# Patient Record
Sex: Female | Born: 1955 | Race: White | Hispanic: No | Smoking: Former smoker
Health system: Southern US, Community
[De-identification: ages and names within clinical notes are randomized; demographics above are authoritative.]

## PROBLEM LIST (undated history)

## (undated) DIAGNOSIS — H0510 Unspecified chronic inflammatory disorders of orbit: Secondary | ICD-10-CM

## (undated) DIAGNOSIS — H02841 Edema of right upper eyelid: Secondary | ICD-10-CM

## (undated) DIAGNOSIS — G4721 Circadian rhythm sleep disorder, delayed sleep phase type: Secondary | ICD-10-CM

## (undated) DIAGNOSIS — G4733 Obstructive sleep apnea (adult) (pediatric): Secondary | ICD-10-CM

## (undated) DIAGNOSIS — J309 Allergic rhinitis, unspecified: Secondary | ICD-10-CM

## (undated) DIAGNOSIS — E669 Obesity, unspecified: Secondary | ICD-10-CM

## (undated) DIAGNOSIS — I10 Essential (primary) hypertension: Secondary | ICD-10-CM

## (undated) DIAGNOSIS — Z6836 Body mass index (BMI) 36.0-36.9, adult: Secondary | ICD-10-CM

## (undated) DIAGNOSIS — E785 Hyperlipidemia, unspecified: Secondary | ICD-10-CM

## (undated) DIAGNOSIS — J45909 Unspecified asthma, uncomplicated: Secondary | ICD-10-CM

## (undated) DIAGNOSIS — M542 Cervicalgia: Secondary | ICD-10-CM

## (undated) DIAGNOSIS — K219 Gastro-esophageal reflux disease without esophagitis: Secondary | ICD-10-CM

## (undated) DIAGNOSIS — N12 Tubulo-interstitial nephritis, not specified as acute or chronic: Secondary | ICD-10-CM

## (undated) DIAGNOSIS — R9389 Abnormal findings on diagnostic imaging of other specified body structures: Principal | ICD-10-CM

## (undated) HISTORY — DX: Body mass index (BMI) 36.0-36.9, adult: Z68.36

## (undated) HISTORY — DX: Unspecified asthma, uncomplicated: J45.909

## (undated) HISTORY — DX: Allergic rhinitis, unspecified: J30.9

## (undated) HISTORY — DX: Cervicalgia: M54.2

## (undated) HISTORY — DX: Hyperlipidemia, unspecified: E78.5

## (undated) HISTORY — DX: Obesity, unspecified: E66.9

## (undated) HISTORY — DX: Circadian rhythm sleep disorder, delayed sleep phase type: G47.21

## (undated) HISTORY — DX: Obstructive sleep apnea (adult) (pediatric): G47.33

## (undated) HISTORY — DX: Gastro-esophageal reflux disease without esophagitis: K21.9

## (undated) NOTE — *Deleted (*Deleted)
Date: 10/12/2020               Patient Name:  Tamara Walton MRN: [id number]  DOB: 06/02/1956 Age / Sex: 48 y.o., female   PCP: Patient, No Pcp Per         Medical Service: Internal Medicine Teaching Service         Attending Physician: Dr. Rolan Bucco, MD    First Contact: Dr. Marland Kitchen Pager: [phone]  Second Contact: Dr. Eliezer Bottom Pager: [phone]-***       After Hours (After 5p/  First Contact Pager: [phone]  weekends / holidays): Second Contact Pager: [phone]   Chief Complaint: Cough/SOB  History of Present Illness: Tamara Walton is a 30 y.o. female with PMH of HTN, HLD, allergic rhinitis and adult onset asthma who presenting to the ED with cough and shortness of breath. Patient states her symptoms originally began around the beginning of the month. She saw her PCP via a virtual visit and prescribed prednisone 10 mg taper. Her symptoms improved initially, but a week ago, she started having worsening SOB and cough. She was seen today at [facility] and a CXR performed which showed left-sided pleural effusion. She was sent to [facility] for further evaluation and management of her pleural effusion. Patient endorse some dyspnea on exertion and feeling fatigue "no energy" but denies fever/chills, chest pain, abdominal pain, hemoptysis, back pain, lower extremity edema/pain, N/V/D, sweats or palpitations.   PCP is Jordan Hawks, PA-C with [facility]  Meds:  Current Meds  Medication Sig  . acetaminophen (TYLENOL) 500 MG tablet Take 1,000 mg by mouth every 6 (six) hours as needed for headache (pain).  Marland Kitchen albuterol (VENTOLIN HFA) 108 (90 Base) MCG/ACT inhaler Inhale 2 puffs into the lungs every 6 (six) hours as needed for wheezing or shortness of breath.  Marland Kitchen amLODipine (NORVASC) 5 MG tablet Take 5 mg by mouth daily.  . Calcium Carbonate Antacid (TUMS PO) Take 1 tablet by mouth 2 (two) times daily as needed (acid reflux).  . fluticasone (FLONASE) 50  MCG/ACT nasal spray Place 1 spray into both nostrils daily.  . hydrochlorothiazide (HYDRODIURIL) 25 MG tablet Take 25 mg by mouth daily.  . montelukast (SINGULAIR) 10 MG tablet Take 10 mg by mouth at bedtime.  . rosuvastatin (CRESTOR) 5 MG tablet Take 5 mg by mouth at bedtime.     Allergies: Allergies as of 10/12/2020 - Review Complete 10/12/2020  Allergen Reaction Noted  . Erythromycin Nausea And Vomiting 07/21/2012  . Strawberry (diagnostic) Hives 10/12/2020  . Amoxicillin-pot clavulanate Nausea And Vomiting and Nausea Only 09/29/2013  . Lisinopril Cough 09/27/2018   Past Medical History:  Diagnosis Date  . Hypertension     Family History: Significant for mother and father who died from lung cancer.   Social History: Lives by herself. She works in an office. She smoked a 1ppd of cigarettes for about 20 years and quit 13 years ago. She denies any ETOH or illicit drug use.   Review of Systems: A complete ROS was negative except as per HPI.   Physical Exam: Blood pressure (!) 158/96, pulse (!) 118, temperature 98.4 F (36.9 C), temperature source Oral, resp. rate (!) 26, height 5\' 5"  (1.651 m), weight 102.1 kg, SpO2 95 %.  General: Pleasant, well-appearing elderly woman laying in bed. No acute distress. Head: Normocephalic. Atraumatic. CV: Tachycardic. Regular Rhythm. No murmurs, rubs, or gallops. No LE edema Pulmonary: Lungs CTAB. Normal  effort. No wheezing. Mild bibasilar rales. Abdominal: Soft, nontender, nondistended. Normal bowel sounds. Extremities: Palpable pulses. Normal ROM. Skin: Warm and dry. No obvious rash or lesions. Neuro: A&Ox3. Moves all extremities. Normal sensation. No focal deficit. Psych: Normal mood and affect  EKG: personally reviewed my interpretation is Sinus tach  CXR: personally reviewed my interpretation is moderate to large left-sided pleural effusion with underlying opacity.  Assessment & Plan by Problem: Active Problems:   * No active  hospital problems. *  #Pleural effusion  #Tachycardia  #HTN  #HLD  #Allergic rhinitis   CODE STATUS: Full Code DIET: 2 gram Sodium PPx: Lovenox 40 mg subcu daily  Dispo: Admit patient to Inpatient with expected length of stay greater than 2 midnights.  Signed: Steffanie Rainwater, MD 10/12/2020, 8:55 PM  Pager: [phone] Internal Medicine Teaching Service After 5pm on weekdays and 1pm on weekends: On Call pager: [phone]

---

## 1999-06-11 ENCOUNTER — Ambulatory Visit (HOSPITAL_COMMUNITY): Admission: RE | Admit: 1999-06-11 | Discharge: 1999-06-11 | Payer: Self-pay | Admitting: Obstetrics and Gynecology

## 2001-03-24 ENCOUNTER — Encounter: Admission: RE | Admit: 2001-03-24 | Discharge: 2001-03-24 | Payer: Self-pay | Admitting: Obstetrics and Gynecology

## 2001-03-24 ENCOUNTER — Encounter: Payer: Self-pay | Admitting: Obstetrics and Gynecology

## 2001-04-07 ENCOUNTER — Other Ambulatory Visit: Admission: RE | Admit: 2001-04-07 | Discharge: 2001-04-07 | Payer: Self-pay | Admitting: Obstetrics and Gynecology

## 2001-04-07 ENCOUNTER — Encounter (INDEPENDENT_AMBULATORY_CARE_PROVIDER_SITE_OTHER): Payer: Self-pay

## 2001-06-20 ENCOUNTER — Ambulatory Visit (HOSPITAL_COMMUNITY): Admission: RE | Admit: 2001-06-20 | Discharge: 2001-06-20 | Payer: Self-pay | Admitting: *Deleted

## 2001-06-20 ENCOUNTER — Encounter: Payer: Self-pay | Admitting: *Deleted

## 2001-06-20 ENCOUNTER — Encounter (INDEPENDENT_AMBULATORY_CARE_PROVIDER_SITE_OTHER): Payer: Self-pay

## 2002-03-27 ENCOUNTER — Emergency Department (HOSPITAL_COMMUNITY): Admission: EM | Admit: 2002-03-27 | Discharge: 2002-03-27 | Payer: Self-pay | Admitting: Emergency Medicine

## 2002-03-27 ENCOUNTER — Encounter: Payer: Self-pay | Admitting: Emergency Medicine

## 2007-02-03 ENCOUNTER — Emergency Department (HOSPITAL_COMMUNITY): Admission: EM | Admit: 2007-02-03 | Discharge: 2007-02-04 | Payer: Self-pay | Admitting: *Deleted

## 2007-10-03 ENCOUNTER — Ambulatory Visit: Payer: Self-pay | Admitting: Gastroenterology

## 2007-10-03 LAB — CONVERTED CEMR LAB
Albumin: 3.7 g/dL (ref 3.5–5.2)
Basophils Absolute: 0.1 10*3/uL (ref 0.0–0.1)
CO2: 32 meq/L (ref 19–32)
Chloride: 102 meq/L (ref 96–112)
Creatinine, Ser: 0.7 mg/dL (ref 0.4–1.2)
Eosinophils Absolute: 0.2 10*3/uL (ref 0.0–0.6)
Glucose, Bld: 88 mg/dL (ref 70–99)
HCT: 37.8 % (ref 36.0–46.0)
Hemoglobin: 13.2 g/dL (ref 12.0–15.0)
MCHC: 34.8 g/dL (ref 30.0–36.0)
MCV: 87.9 fL (ref 78.0–100.0)
Monocytes Absolute: 0.6 10*3/uL (ref 0.2–0.7)
Monocytes Relative: 7.7 % (ref 3.0–11.0)
Neutrophils Relative %: 57.2 % (ref 43.0–77.0)
Sodium: 140 meq/L (ref 135–145)
Total Bilirubin: 0.6 mg/dL (ref 0.3–1.2)

## 2007-10-06 ENCOUNTER — Ambulatory Visit (HOSPITAL_COMMUNITY): Admission: RE | Admit: 2007-10-06 | Discharge: 2007-10-06 | Payer: Self-pay | Admitting: Gastroenterology

## 2007-11-08 ENCOUNTER — Ambulatory Visit: Payer: Self-pay | Admitting: Gastroenterology

## 2008-01-11 DIAGNOSIS — T7840XA Allergy, unspecified, initial encounter: Secondary | ICD-10-CM | POA: Insufficient documentation

## 2009-03-12 ENCOUNTER — Other Ambulatory Visit: Admission: RE | Admit: 2009-03-12 | Discharge: 2009-03-12 | Payer: Self-pay | Admitting: Family Medicine

## 2009-05-07 MED ORDER — METHYLPREDNISOLONE 4 MG TABS IN A DOSE PACK
4 mg | PACK | ORAL | Status: DC
Start: 2009-05-07 — End: 2010-02-27

## 2009-05-07 MED ORDER — CHLORPHEN-PHENYLEPH-HYDROCODON 2 MG-5 MG-2.5 MG/5 ML ORAL SYRUP
Freq: Four times a day (QID) | ORAL | Status: DC | PRN
Start: 2009-05-07 — End: 2010-02-27

## 2009-05-07 MED ORDER — OXYBUTYNIN CHLORIDE 5 MG TAB
5 mg | ORAL_TABLET | Freq: Two times a day (BID) | ORAL | Status: DC
Start: 2009-05-07 — End: 2010-02-27

## 2009-05-07 MED ORDER — AZITHROMYCIN 250 MG TAB
250 mg | ORAL_TABLET | ORAL | Status: AC
Start: 2009-05-07 — End: 2009-05-12

## 2009-05-07 NOTE — Progress Notes (Signed)
SUBJECTIVE:  Patient here for GYN exam.  It has been awhile since she has had one.  Complaining of some hot flashes.  Also complaining of stress urinary incontinence symptoms not relieved by Detrol previously.  Also his had recent URI symptoms for the past several days with several days of nasal congestion, clear mucus, cough, green sputum, shortness of breath and some wheezing.  She continues to smoke.    OBJECTIVE:  Alert and oriented.  No acute respiratory distress.  Ears, canals and TMs are normal.  Nasal passages are clear.  Pharynx is normal.  Neck without lymphadenopathy or masses.  Heart is regular.  Lungs with diffuse mild expiratory wheeze.  Breasts without masses.  External genitalia, vagina and cervix appear normal.  Pap smear was done.  Bimanual exam is normal.        ASSESSMENT:  Bronchitis.  Menopause.  Stress urinary incontinence.    PLAN:  Zithromax five-day course.  Histinex for cough.  Medrol Dosepak times one.  Ditropan short acting one b.i.d.  Return as needed.  Strongly urged to stop smoking.  Face-to-face time greater than 25 minutes.  Greater than 50% of today's visit devoted to counseling and coordination of care.

## 2009-05-07 NOTE — Progress Notes (Signed)
Patient here for gyn exam and also has a cold.  Tara Hogan

## 2009-05-08 NOTE — Progress Notes (Addendum)
Addended by: Lidia Collum on: 05/08/2009      Modules accepted: Orders

## 2009-08-05 ENCOUNTER — Ambulatory Visit: Payer: Self-pay | Admitting: Diagnostic Radiology

## 2009-08-05 ENCOUNTER — Encounter (INDEPENDENT_AMBULATORY_CARE_PROVIDER_SITE_OTHER): Payer: Self-pay | Admitting: *Deleted

## 2009-08-05 ENCOUNTER — Ambulatory Visit: Payer: Self-pay | Admitting: Internal Medicine

## 2009-08-05 ENCOUNTER — Encounter: Payer: Self-pay | Admitting: Emergency Medicine

## 2009-08-05 ENCOUNTER — Inpatient Hospital Stay (HOSPITAL_COMMUNITY): Admission: EM | Admit: 2009-08-05 | Discharge: 2009-08-09 | Payer: Self-pay | Admitting: Internal Medicine

## 2009-08-16 ENCOUNTER — Telehealth: Payer: Self-pay | Admitting: Gastroenterology

## 2010-02-27 LAB — AMB POC URINALYSIS DIP STICK MANUAL W/O MICRO
Bilirubin (UA POC): NEGATIVE
Glucose (UA POC): NEGATIVE
Ketones (UA POC): NEGATIVE
Leukocyte esterase (UA POC): NEGATIVE
Nitrites (UA POC): NEGATIVE
Protein (UA POC): NEGATIVE mg/dL
Specific gravity (UA POC): 1.03 (ref 1.001–1.035)
pH (UA POC): 5 (ref 4.6–8.0)

## 2010-02-27 MED ORDER — TOLTERODINE 2 MG TAB
2 mg | ORAL_TABLET | Freq: Two times a day (BID) | ORAL | Status: DC
Start: 2010-02-27 — End: 2017-05-21

## 2010-02-27 NOTE — Progress Notes (Signed)
SUBJECTIVE:  Patient here today for Pap smear.  Also having symptoms now compatible with stress urinary incontinence.  Previous Ditropan was not helpful.  Last mammogram several years ago.    OBJECTIVE:  External genitalia, vagina and cervix are somewhat atrophic.  Pap smear was done.  Bimanual exam shows slightly enlarged uterus.  No unusual masses.  Urinalysis dipstick is normal.    ASSESSMENT:  Menopause.  Stress urinary incontinence.    PLAN:  Sent for screening mammogram.  Screening lab work drawn today.  Try Detrol 2 mg b.i.d. For urinary symptoms.  If no improvement, consider urology consultation.  Face-to-face time greater than 25 minutes.  Greater than 50% of today's visit devoted to counseling and coordination of care.

## 2010-02-27 NOTE — Progress Notes (Signed)
Patient here for pap.  Tara Hogan

## 2010-02-28 LAB — METABOLIC PANEL, COMPREHENSIVE
A-G Ratio: 1.6 (ref 1.1–2.5)
ALT (SGPT): 14 IU/L (ref 0–40)
AST (SGOT): 18 IU/L (ref 0–40)
Albumin: 4.2 g/dL (ref 3.5–5.5)
Alk. phosphatase: 68 IU/L (ref 25–150)
BUN/Creatinine ratio: 17 (ref 9–23)
BUN: 18 mg/dL (ref 6–24)
Bilirubin, total: 0.2 mg/dL (ref 0.0–1.2)
CO2: 20 mmol/L (ref 20–32)
Calcium: 9.1 mg/dL (ref 8.7–10.2)
Chloride: 106 mmol/L (ref 97–108)
Creatinine: 1.05 mg/dL — ABNORMAL HIGH (ref 0.57–1.00)
GFR est AA: 71 mL/min/{1.73_m2} (ref >59)
GFR est non-AA: 61 mL/min/{1.73_m2} (ref >59)
GLOBULIN, TOTAL: 2.7 g/dL (ref 1.5–4.5)
Glucose: 71 mg/dL (ref 65–99)
Potassium: 4.9 mmol/L (ref 3.5–5.2)
Protein, total: 6.9 g/dL (ref 6.0–8.5)
Sodium: 142 mmol/L (ref 135–145)

## 2010-02-28 LAB — LIPID PANEL
Cholesterol, total: 188 mg/dL (ref 100–199)
HDL Cholesterol: 65 mg/dL (ref >39)
LDL, calculated: 101 mg/dL — ABNORMAL HIGH (ref 0–99)
Triglyceride: 108 mg/dL (ref 0–149)
VLDL, calculated: 22 mg/dL (ref 5–40)

## 2010-02-28 LAB — CBC W/O DIFF
HCT: 45.2 % — ABNORMAL HIGH (ref 34.0–44.0)
HGB: 15.6 g/dL — ABNORMAL HIGH (ref 11.5–15.0)
MCH: 32 pg (ref 27.0–34.0)
MCHC: 34.5 g/dL (ref 32.0–36.0)
MCV: 93 fL (ref 80–98)
PLATELET: 293 10*3/uL (ref 140–415)
RBC: 4.87 x10E6/uL (ref 3.80–5.10)
RDW: 13.5 % (ref 11.7–15.0)
WBC: 12.5 10*3/uL — ABNORMAL HIGH (ref 4.0–10.5)

## 2010-02-28 NOTE — Progress Notes (Addendum)
Quick Note:    Call pt.- results nl.    ______

## 2010-02-28 NOTE — Progress Notes (Addendum)
Quick Note:    Letter sent, invalid phone.    ______

## 2010-03-07 NOTE — Telephone Encounter (Signed)
Unable to reach patient to notify pap smear results.  Pap shows mild inflammation, not serious, recheck pap again in six months.  Letter sent.  Tara Hogan

## 2010-08-29 NOTE — Progress Notes (Signed)
Pt is here to see Dr for pap smear.Tara Hogan

## 2010-08-29 NOTE — Progress Notes (Signed)
SUBJECTIVE:  Patient returns for repeat Pap smear and HPV screen in April of this year her Pap smear was normal, HPV screen was positive. No bleeding or pelvic pain. She never got mammogram done.    OBJECTIVE:  External genitalia, vagina and cervix appear normal. Pap Smear and HPV was obtained again today.    ASSESSMENT:  As above.    PLAN:  Mammogram referral given to the patient. She says she will get a mammogram done.

## 2010-09-12 NOTE — Progress Notes (Signed)
Quick Note:    Call pt. PAP normal except evidence of inflammation. Not cancer. Not serious. I would like to recheck PAP in 6 months.  ______

## 2010-09-15 NOTE — Progress Notes (Signed)
Quick Note:    Pt given message.  Blanchard Willhite M Cendy Oconnor, RN   ______

## 2010-12-15 ENCOUNTER — Encounter: Payer: Self-pay | Admitting: Family Medicine

## 2011-02-27 LAB — COMPREHENSIVE METABOLIC PANEL
AST: 25 U/L (ref 0–37)
Albumin: 4.3 g/dL (ref 3.5–5.2)
Alkaline Phosphatase: 92 U/L (ref 39–117)
BUN: 6 mg/dL (ref 6–23)
CO2: 24 mEq/L (ref 19–32)
Chloride: 98 mEq/L (ref 96–112)
Creatinine, Ser: 1.1 mg/dL (ref 0.4–1.2)
GFR calc Af Amer: >60 mL/min (ref >60)
GFR calc non Af Amer: >60 mL/min (ref >60)
Glucose, Bld: 105 mg/dL — ABNORMAL HIGH (ref 70–99)
Potassium: 2.8 mEq/L — ABNORMAL LOW (ref 3.5–5.1)
Potassium: 3.9 mEq/L (ref 3.5–5.1)
Sodium: 136 mEq/L (ref 135–145)
Total Bilirubin: 0.5 mg/dL (ref 0.3–1.2)
Total Protein: 5.9 g/dL — ABNORMAL LOW (ref 6.0–8.3)

## 2011-02-27 LAB — FECAL LACTOFERRIN, QUANT

## 2011-02-27 LAB — CULTURE, BLOOD (ROUTINE X 2)

## 2011-02-27 LAB — BASIC METABOLIC PANEL
BUN: 4 mg/dL — ABNORMAL LOW (ref 6–23)
BUN: 6 mg/dL (ref 6–23)
CO2: 22 mEq/L (ref 19–32)
CO2: 25 mEq/L (ref 19–32)
Chloride: 110 mEq/L (ref 96–112)
Chloride: 110 mEq/L (ref 96–112)
Creatinine, Ser: 0.81 mg/dL (ref 0.4–1.2)
GFR calc non Af Amer: >60 mL/min (ref >60)
Glucose, Bld: 121 mg/dL — ABNORMAL HIGH (ref 70–99)
Glucose, Bld: 97 mg/dL (ref 70–99)
Potassium: 2.9 mEq/L — ABNORMAL LOW (ref 3.5–5.1)
Sodium: 139 mEq/L (ref 135–145)

## 2011-02-27 LAB — CBC
HCT: 28.4 % — ABNORMAL LOW (ref 36.0–46.0)
HCT: 30.3 % — ABNORMAL LOW (ref 36.0–46.0)
Hemoglobin: 9.7 g/dL — ABNORMAL LOW (ref 12.0–15.0)
Hemoglobin: 10.3 g/dL — ABNORMAL LOW (ref 12.0–15.0)
MCHC: 34.2 g/dL (ref 30.0–36.0)
MCHC: 34.2 g/dL (ref 30.0–36.0)
MCHC: 34.1 g/dL (ref 30.0–36.0)
MCV: 88.2 fL (ref 78.0–100.0)
MCV: 88.2 fL (ref 78.0–100.0)
MCV: 87.8 fL (ref 78.0–100.0)
Platelets: 261 10*3/uL (ref 150–400)
Platelets: 310 10*3/uL (ref 150–400)
RDW: 13.2 % (ref 11.5–15.5)
RDW: 13.7 % (ref 11.5–15.5)
RDW: 13.6 % (ref 11.5–15.5)
WBC: 13.8 10*3/uL — ABNORMAL HIGH (ref 4.0–10.5)

## 2011-02-27 LAB — RETICULOCYTES
RBC.: 3.62 MIL/uL — ABNORMAL LOW (ref 3.87–5.11)
RBC.: 4.27 MIL/uL (ref 3.87–5.11)
Retic Count, Absolute: 32.6 10*3/uL (ref 19.0–186.0)
Retic Ct Pct: 0.9 % (ref 0.4–3.1)
Retic Ct Pct: 1.8 % (ref 0.4–3.1)

## 2011-02-27 LAB — IRON AND TIBC
Iron: 13 ug/dL — ABNORMAL LOW (ref 42–135)
UIBC: 184 ug/dL

## 2011-02-27 LAB — STOOL CULTURE

## 2011-02-27 LAB — MAGNESIUM: Magnesium: 2.1 mg/dL (ref 1.5–2.5)

## 2011-02-27 LAB — POCT CARDIAC MARKERS
CKMB, poc: <1 ng/mL — ABNORMAL LOW (ref 1.0–8.0)
Myoglobin, poc: 88.7 ng/mL (ref 12–200)

## 2011-02-27 LAB — DIFFERENTIAL
Basophils Absolute: 0.2 10*3/uL — ABNORMAL HIGH (ref 0.0–0.1)
Eosinophils Relative: 0 % (ref 0–5)
Lymphocytes Relative: 13 % (ref 12–46)
Lymphs Abs: 1.7 10*3/uL (ref 0.7–4.0)
Monocytes Absolute: 0.9 10*3/uL (ref 0.1–1.0)

## 2011-02-27 LAB — CLOSTRIDIUM DIFFICILE EIA

## 2011-02-27 LAB — LACTIC ACID, PLASMA: Lactic Acid, Venous: 1.1 mmol/L (ref 0.5–2.2)

## 2011-02-27 LAB — OVA AND PARASITE EXAMINATION

## 2011-02-27 LAB — VITAMIN B12: Vitamin B-12: 198 pg/mL — ABNORMAL LOW (ref 211–911)

## 2011-02-27 LAB — HEMOCCULT GUIAC POC 1CARD (OFFICE): Fecal Occult Bld: POSITIVE

## 2011-04-07 NOTE — Assessment & Plan Note (Signed)
[facility]                         GASTROENTEROLOGY OFFICE NOTE   Tamara, Walton                     MRN:          [id number]  DATE:10/03/2007                            DOB:          05/11/1956    REFERRING PHYSICIAN:  Maryelizabeth Rowan, M.D.   REASON FOR REFERRAL:  Dr. Duanne Guess asked me to evaluate Ms. Frankowski in  consultation regarding epigastric pain, belching, nausea.   HISTORY OF PRESENT ILLNESS:  Tamara Walton is a very pleasant 55 year old  woman who has had approximately four months of a variety of upper GI  symptoms.  She says she has had flares or epigastric pain.  She points  to her mid epigastrium as the spot, it could be a tightness, somewhat of  a burning sensation, lasts for minutes to an hour or so.  She actually  wanted to present to the emergency room because she thought it may be  her heart, and they did an EKG and told her heart was fine.  Along with  this, she has had some nausea and some belching.  At some point, a  caregiver tried a GI cocktail on her and her symptoms did seem to  improve with that, so she was started on omeprazole.  She is taking once  daily omeprazole in the morning and did not really seem to notice a  difference, still had 2-3 flares and recently doubled her omeprazole.  Since doubling her omeprazole, she has only had one or two more flares.  She has never had frank pyrosis, never acid regurgitation and no  dysphagia.  Belching definitely helps her symptoms.   LABORATORY DATA:  Recent, August 2008, showed a normal comprehensive  metabolic profile.  H.pylori was negative.   REVIEW OF SYSTEMS:  Essentially normal and available on the nursing  intake sheet.   PAST MEDICAL HISTORY:  1. Hypertension.  2. Thyroid nodules.  3. Status post hysterectomy.  4. Status post tubal ligation.  5. Allergies for 30 years.   CURRENT MEDICATIONS:  Benicar, omeprazole, Singulair.   ALLERGIES:  ERYTHROMYCIN.   SOCIAL HISTORY:  Widowed with three children, works in Airline pilot, smokes a  pack of cigarettes a day, stopped drinking caffeine 3-4 months with the  beginning of her symptoms as someone told her could make things worse.  Rarely drinks alcohol.   FAMILY HISTORY:  No colon cancer, colon polyps in family.   PHYSICAL EXAMINATION:  VITAL SIGNS:  Height 5 feet 6 inches, 168 pounds,  blood pressure 150/90, pulse 68.  CONSTITUTIONAL:  Generally well-appearing.  NEUROLOGICAL:  Alert and oriented x3.  HEENT:  Eyes:  Extraocular movements intact.  Mouth:  Oropharynx moist.  No lesions.  NECK:  Supple.  No lymphadenopathy.  CARDIOVASCULAR:  Heart regular rate and rhythm.  LUNGS:  Clear to auscultation bilaterally.  ABDOMEN:  Soft, nontender, nondistended.  Normal bowel sounds.  EXTREMITIES:  No lower extremity edema.  SKIN:  No rashes or lesions of lower extremities.   ASSESSMENT/PLAN:  A 55 year old woman with a variety of upper GI  symptoms, possibly GERD, possibly GERD related dyspepsia, perhaps  possibly biliary source.  I think we will proceed with a repeat set of labs including complete  metabolic profile and CBC.  Recommend that she cut back to one  omeprazole a day and take 1-2 Gas-X with every meal since belching does  seem to improve her symptoms.  Will arrange for her to have an EGD at  her soonest convenience, and she will also get a right upper quadrant  ultrasound to check for gallstones.  At last, I have given her a GERD  handout.     Rachael Fee, MD  Electronically Signed    DPJ/MedQ  DD: 10/03/2007  DT: 10/04/2007  Job #: [id number]   cc:   Maryelizabeth Rowan, M.D.

## 2013-03-16 NOTE — Progress Notes (Signed)
Pt is here to see Dr for pap.Tara Hogan

## 2013-03-16 NOTE — Progress Notes (Signed)
SUBJECTIVE:  Returns for Pap smear. History of positive HPV x2 in 2011. Also has URI symptoms with nasal congestion, yellow mucus and cough with yellow sputum x2 weeks. No fever, headache, ear symptoms, sore throat, shortness of breath or wheezing. She continues to smoke. States she had normal mammogram in 2011. Never had colonoscopy.    OBJECTIVE:  BP 100/78   Pulse 97   Ht 5\' 9"  (1.753 m)   Wt 221 lb (100.245 kg)   BMI 32.62 kg/m2   SpO2 98%  Alert and oriented.  No acute respiratory distress.  Ears, canals and TMs are normal.  Nasal passages are clear.  Pharynx is normal.  Neck without lymphadenopathy or masses.  Heart is regular.  Lungs are clear.  External genitalia, vagina and cervix appear normal. Pap smear and HPV 16, 18 obtained.    ASSESSMENT:  As above.    PLAN:  Amoxicillin for URI symptoms. Urged to stop smoking. Declines screening colonoscopy.  Repeat screening mammogram ordered.  Face-to-face time greater than 25 minutes.  Greater than 50% of today's visit devoted to counseling and coordination of care.

## 2013-03-23 NOTE — Progress Notes (Signed)
Quick Note:    Call pt.- results nl.  PAP  ______

## 2013-11-29 ENCOUNTER — Other Ambulatory Visit: Payer: Self-pay | Admitting: Family Medicine

## 2013-11-29 DIAGNOSIS — N63 Unspecified lump in unspecified breast: Secondary | ICD-10-CM

## 2013-12-07 ENCOUNTER — Other Ambulatory Visit: Payer: Self-pay

## 2013-12-18 ENCOUNTER — Other Ambulatory Visit: Payer: Self-pay

## 2013-12-18 ENCOUNTER — Inpatient Hospital Stay: Admission: RE | Admit: 2013-12-18 | Payer: Self-pay | Source: Ambulatory Visit

## 2014-02-22 ENCOUNTER — Ambulatory Visit
Admission: RE | Admit: 2014-02-22 | Discharge: 2014-02-22 | Disposition: A | Discharge status: Home / Self Care | Payer: 59 | Source: Ambulatory Visit | Attending: Family Medicine | Admitting: Family Medicine

## 2014-02-22 DIAGNOSIS — N63 Unspecified lump in unspecified breast: Secondary | ICD-10-CM

## 2014-10-11 NOTE — Telephone Encounter (Signed)
Patient called wanting to know if she can get an antibiotic called into [facility]. Please call patient back [phone].

## 2014-10-11 NOTE — Telephone Encounter (Signed)
Called her and explained that she needs to be seen. She said she won't have money for her co-pay for next 2 weeks. She called her dentist and said he couldn't see her right now either. She said she doesn't have the money to come in but her gum is swollen around the tooth.

## 2014-11-05 ENCOUNTER — Ambulatory Visit
Admit: 2014-11-05 | Discharge: 2014-11-05 | Attending: Internal Medicine | Primary: Student in an Organized Health Care Education/Training Program

## 2014-11-05 DIAGNOSIS — R062 Wheezing: Secondary | ICD-10-CM

## 2014-11-05 MED ORDER — CEFTRIAXONE 1 GRAM SOLUTION FOR INJECTION
1 gram | Freq: Once | INTRAMUSCULAR | Status: AC
Start: 2014-11-05 — End: 2014-11-05

## 2014-11-05 MED ORDER — METHYLPREDNISOLONE 80 MG/ML SUSP FOR INJECTION
80 mg/mL | Freq: Once | INTRAMUSCULAR | Status: AC
Start: 2014-11-05 — End: 2014-11-05

## 2014-11-05 MED ORDER — AMOXICILLIN CLAVULANATE 875 MG-125 MG TAB
875-125 mg | ORAL_TABLET | Freq: Two times a day (BID) | ORAL | Status: AC
Start: 2014-11-05 — End: 2014-11-15

## 2014-11-05 MED ORDER — PREDNISONE 20 MG TAB
20 mg | ORAL_TABLET | Freq: Every day | ORAL | Status: DC
Start: 2014-11-05 — End: 2017-05-21

## 2014-11-05 MED ORDER — ALBUTEROL SULFATE 0.083 % (0.83 MG/ML) SOLN FOR INHALATION
2.5 mg /3 mL (0.083 %) | RESPIRATORY_TRACT | Status: DC | PRN
Start: 2014-11-05 — End: 2017-05-21

## 2014-11-05 MED ORDER — IPRATROPIUM-ALBUTEROL 2.5 MG-0.5 MG/3 ML NEB SOLUTION
2.5 mg-0.5 mg/3 ml | Freq: Once | RESPIRATORY_TRACT | Status: AC
Start: 2014-11-05 — End: 2014-11-05

## 2014-11-05 NOTE — Progress Notes (Signed)
Subjective:   Tara Hogan is a 58 y.o. female who rarely comes to the doctor.  Today she complains of dry cough, productive cough, fever and chills for 5 days, gradually worsening since that time. No stomach upset.  No myalgia.  Cough productive of yellow phlegm.  Fever and sore throat were last week.  Now with chills malaise and productive cough.  History of smoking but no previously diagnosed COPD. Tried OTC cold remedies with temporary relief, but feeling horrible.  No evaluation to date.     History reviewed. No pertinent past medical history.     History   Substance Use Topics   ??? Smoking status: Current Every Day Smoker -- 1.00 packs/day     Types: Cigarettes   ??? Smokeless tobacco: Never Used   ??? Alcohol Use: No     Current Outpatient Prescriptions on File Prior to Visit   Medication Sig Dispense Refill   ??? tolterodine (DETROL) 2 mg tablet Take 1 Tab by mouth two (2) times a day. 60 Tab 5     No current facility-administered medications on file prior to visit.     No Known Allergies     Review of Systems  Pertinent items are noted in HPI.    Objective:     BP 122/70 mmHg   Pulse 95   Temp(Src) 98.4 ??F (36.9 ??C) (Oral)   Resp 20   Ht 5\' 9"  (1.753 m)   Wt 213 lb (96.616 kg)   BMI 31.44 kg/m2   SpO2 91%  General:   alert, cooperative    Eyes: conjunctivae/scleras clear. PERRL, EOM's intact   Ears: External auditory canals clear, tympanic membranes clear   Sinuses/Nose: No maxillary or frontal tenderness.  No rhinorrhea present.   Mouth:  No oral lesions, mild pharyngeal erythema, no exudates   Neck: Supple, trachea midline   Heart: S1 and S2 normal,no murmurs noted    Lungs: Mildly increased work of breathing at rest.  Scattered inspiratory and expiratory wheeze.   Abdomen: Soft, nontender.  Normal bowel sounds   Extremities: No edema or cyanosis             Assessment/Plan:       ICD-10-CM ICD-9-CM    1. Wheezing R06.2 786.07 ALBUTEROL IPRATROP NON-COMP      PR INHAL RX, AIRWAY OBST/DX SPUTUM INDUCT       CEFTRIAXONE SODIUM INJECTION PER 250 MG      METHYLPREDNISOLONE ACETATE INJECTION 80 MG      PR THER/PROPH/DIAG INJECTION, SUBCUT/IM   2. Tobacco abuse Z72.0 305.1    3. Viral syndrome B34.9 079.99        Patient's distress improved with back to back duoneb. Cost is a barrier to access and care.  She has borrowed her cousin's nebulizer.  She states her daughter can pick up her medications tonight and she can take first doses tomorrow.  Injections given to bridge her to oral meds.    Orders Placed This Encounter   ??? ALBUTEROL IPRATROP NON-COMP     Order Specific Question:  Dose     Answer:  0.5 mg/ 3 mg per 3 mL     Order Specific Question:  Site     Answer:  OTHER      Comments:  inhalation     Order Specific Question:  Expiration Date     Answer:  04/24/2015     Order Specific Question:  Lot#     Answer:  [id number]  Order Specific Question:  Manufacturer     Answer:  [employer]     Order Specific Question:  Charge Quantity?     Answer:  1     Order Specific Question:  Perfomed by/Witnessed by:     Answer:  Tyler DeisSonya Wilson, LPn     Order Specific Question:  NDC#     Answer:  [phone]   ??? PR INHAL RX, AIRWAY OBST/DX SPUTUM INDUCT   ??? CEFTRIAXONE SODIUM INJECTION PER 250 MG (Qty 4 for 1 gm)     Order Specific Question:  Charge Quantity?     Answer:  4     Order Specific Question:  Dose     Answer:  1 gram     Order Specific Question:  Site     Answer:  RIGHT GLUTEUS     Order Specific Question:  Expiration Date     Answer:  01/21/2017     Order Specific Question:  Lot#     Answer:  [id number]     Order Specific Question:  Manufacturer     Answer:  [employer]     Order Specific Question:  Perfomed by/Witnessed by:     Answer:  Tyler DeisSonya Wilson, LPN     Order Specific Question:  NDC#     Answer:  [id number]   ??? METHYLPREDNISOLONE ACETATE INJECTION 80 MG     Order Specific Question:  Dose     Answer:  80 mg     Order Specific Question:  Site     Answer:  LEFT GLUTEUS      Order Specific Question:  Expiration Date     Answer:  01/21/2017     Order Specific Question:  Lot#     Answer:  [id number]     Order Specific Question:  Manufacturer     Answer:  [employer]     Order Specific Question:  Charge Quantity?     Answer:  1     Order Specific Question:  Perfomed by/Witnessed by:     Answer:  Tyler DeisSonya Wilson, LPN     Order Specific Question:  NDC#     Answer:  [id number]   ??? PR THER/PROPH/DIAG INJECTION, SUBCUT/IM   ??? albuterol-ipratropium (DUO-NEB) 2.5 mg-0.5 mg/3 ml nebulizer solution     Sig: 3 mL by Nebulization route once for 1 dose.     Dispense:  30 Vial     Refill:  0   ??? albuterol (PROVENTIL VENTOLIN) 2.5 mg /3 mL (0.083 %) nebulizer solution     Sig: 3 mL by Nebulization route every four (4) hours as needed for Wheezing.     Dispense:  50 Each     Refill:  0   ??? amoxicillin-clavulanate (AUGMENTIN) 875-125 mg per tablet     Sig: Take 1 Tab by mouth two (2) times a day for 10 days.     Dispense:  20 Tab     Refill:  0   ??? predniSONE (DELTASONE) 20 mg tablet     Sig: Take 1 Tab by mouth daily (with breakfast).     Dispense:  15 Tab     Refill:  0   ??? cefTRIAXone (ROCEPHIN) 1 gram injection     Sig: 1 g by IntraMUSCular route once for 1 dose.     Dispense:  1 Vial     Refill:  0   ??? methylPREDNISolone acetate (DEPO-MEDROL) 80 mg/mL injection     Sig:  1 mL by IntraMUSCular route once for 1 dose.     Dispense:  1 Vial     Refill:  0       Verbal and written instructions (see AVS) provided.  Patient expresses understanding of diagnosis and treatment plan.

## 2014-11-05 NOTE — Patient Instructions (Signed)
Wheezing or Bronchoconstriction: After Your Visit  Your Care Instructions  Wheezing is a whistling noise made during breathing. It occurs when the small airways, or bronchial tubes, that lead to your lungs swell or contract (spasm) and become narrow. This narrowing is called bronchoconstriction. When your airways constrict, it is hard for air to pass through and this makes it hard for you to breathe.  Wheezing and bronchoconstriction can be caused by many problems, including:  ?? An infection such as the flu or a cold.  ?? Allergies such as hay fever.  ?? Diseases such as asthma or chronic obstructive pulmonary disease.  ?? Smoking.  Treatment for your wheezing depends on what is causing the problem. Your wheezing may get better without treatment. But you may need to pay attention to things that cause your wheezing and avoid them. Or you may need medicine to help treat the wheezing and to reduce the swelling or to relieve spasms in your lungs.  Follow-up care is a key part of your treatment and safety. Be sure to make and go to all appointments, and call your doctor if you are having problems. It is also a good idea to know your test results and keep a list of the medicines you take.  How can you care for yourself at home?  ?? Take your medicine exactly as prescribed. Call your doctor if you think you are having a problem with your medicine. You will get more details on the specific medicine your doctor prescribes.  ?? If your doctor prescribed antibiotics, take them as directed. Do not stop taking them just because you feel better. You need to take the full course of antibiotics.  ?? Breathe moist air from a humidifier, hot shower, or sink filled with hot water. This may help ease your symptoms and make it easier for you to breathe.  ?? If you have congestion in your nose and throat, drinking plenty of fluids, especially hot fluids, may help relieve your symptoms. If you have  kidney, heart, or liver disease and have to limit fluids, talk with your doctor before you increase the amount of fluids you drink.  ?? If you have mucus in your airways, it may help to breathe deeply and cough.  ?? Do not smoke or allow others to smoke around you. Smoking can make your wheezing worse. If you need help quitting, talk to your doctor about stop-smoking programs and medicines. These can increase your chances of quitting for good.  ?? Avoid things that may cause your wheezing. These may include colds, smoke, air pollution, dust, pollen, pets, cockroaches, stress, and cold air.  When should you call for help?  Call 911 anytime you think you may need emergency care. For example, call if:  ?? You have severe trouble breathing.  ?? You passed out (lost consciousness).  Call your doctor now or seek immediate medical care if:  ?? You cough up yellow, dark brown, or bloody mucus (sputum).  ?? You have new or worse shortness of breath.  ?? Your wheezing is not getting better or it gets worse after you start taking your medicine.  Watch closely for changes in your health, and be sure to contact your doctor if:  ?? You do not get better as expected.   Where can you learn more?   Go to http://www.healthwise.net/[facility]  Enter V454 in the search box to learn more about "Wheezing or Bronchoconstriction: After Your Visit."   ?? 2006-2015 Healthwise[employer]. Care instructions   adapted under license by [employer] (which disclaims liability or warranty for this information). This care instruction is for use with your licensed healthcare professional. If you have questions about a medical condition or this instruction, always ask your healthcare professional. Healthwise, Incorporated disclaims any warranty or liability for your use of this information.  Content Version: 10.5.422740; Current as of: August 01, 2013

## 2014-11-05 NOTE — Progress Notes (Signed)
Chief Complaint   Patient presents with   ??? Cough   ??? Shortness of Breath   ??? Wheezing     Patient states she has had these symptoms for 6 days. Patient states her cough is non-productive.

## 2014-11-20 NOTE — Telephone Encounter (Signed)
Pt called back and she will keep appt with Dr Zoila Shutterosenberg tomm at 1130.

## 2014-11-20 NOTE — Telephone Encounter (Signed)
Call placed to pt per orders Dr Adriana Simasook appt made for tomm with Dr Zoila Shutterosenberg at [id number]. Message left to call office at earliest time today.

## 2014-11-20 NOTE — Telephone Encounter (Signed)
Pt is calling wanting to speak with dr Adriana Simascook about being seen 2 weeks ago and believes she needs another antibiotic

## 2014-11-20 NOTE — Telephone Encounter (Signed)
Spoke to pt and she states that she was seen 11/05/14. Now states that she has a cough and bringing up thick,greenish mucus when coughing. Wants to know if she needs another antibiotic. Routing to Dr Adriana Simasook.

## 2014-11-20 NOTE — Telephone Encounter (Signed)
She should be seen.  She was quite sick when she was here 2 weeks ago.  Can she be put in with Dr. Elvera Lennox today or with anyone tomorrow?

## 2014-11-21 ENCOUNTER — Ambulatory Visit
Admit: 2014-11-21 | Discharge: 2014-11-21 | Attending: Family Medicine | Primary: Student in an Organized Health Care Education/Training Program

## 2014-11-21 DIAGNOSIS — J069 Acute upper respiratory infection, unspecified: Secondary | ICD-10-CM

## 2014-11-21 MED ORDER — CEPHALEXIN 500 MG CAP
500 mg | ORAL_CAPSULE | Freq: Three times a day (TID) | ORAL | Status: AC
Start: 2014-11-21 — End: 2014-12-01

## 2014-11-21 NOTE — Progress Notes (Signed)
.    Chief Complaint   Patient presents with   ??? Cough   ??? Nasal Congestion   .Tara Hogan

## 2014-11-22 NOTE — Progress Notes (Signed)
SUBJECTIVE:  Returns for followup from previous visit regarding bronchitis. Symptoms improved but not completely gone. Still with significant cough and sputum production. Less wheezing. Plans to stop smoking after January 1.    OBJECTIVE:  BP 112/72 mmHg   Pulse 88   Temp(Src) 98.2 ??F (36.8 ??C)   Resp 18   Ht 5\' 9"  (1.753 m)   Wt 221 lb 6.4 oz (100.426 kg)   BMI 32.68 kg/m2   SpO2 98%  Alert and oriented.  No acute respiratory distress.  Ears, canals and TMs are normal.  Nasal passages are clear.  Pharynx is normal.  Neck without lymphadenopathy or masses.  Heart is regular.  Lungs are clear.      ASSESSMENT:  Partially resolved bronchitis.    PLAN:  Keflex 500 mg t.i.d. For 10 days. Encouraged smoking cessation.

## 2015-05-22 NOTE — Telephone Encounter (Signed)
She is having shoulder pain tried to set her up for tomorrow but she said she only wants to see Dr. Zoila Shutterosenberg. I explained that he isn't here tomorrow but that she could see someone else and she said no and she may go to the ER

## 2015-05-22 NOTE — Telephone Encounter (Signed)
Pt is calling wanting to speak with Dr Zoila Shutterosenberg in ref to her shoulder bothering her

## 2017-05-21 ENCOUNTER — Inpatient Hospital Stay
Admit: 2017-05-21 | Discharge: 2017-05-21 | Disposition: A | Discharge status: Home / Self Care | Payer: Self-pay | Attending: Emergency Medicine

## 2017-05-21 DIAGNOSIS — S76919A Strain of unspecified muscles, fascia and tendons at thigh level, unspecified thigh, initial encounter: Secondary | ICD-10-CM

## 2017-05-21 MED ORDER — METHOCARBAMOL 500 MG TAB
500 mg | ORAL_TABLET | Freq: Four times a day (QID) | ORAL | 0 refills | Status: DC
Start: 2017-05-21 — End: 2017-08-02

## 2017-05-21 MED ORDER — ERYTHROMYCIN 5 MG/G EYE OINTMENT
5 mg/gram (0. %) | OPHTHALMIC | 0 refills | Status: AC
Start: 2017-05-21 — End: 2017-05-28

## 2017-05-21 MED ORDER — KETOTIFEN 0.025 % (0.035 %) EYE DROPS
0.025 % (0.035 %) | Freq: Two times a day (BID) | OPHTHALMIC | 0 refills | Status: AC
Start: 2017-05-21 — End: 2017-05-31

## 2017-05-21 NOTE — ED Notes (Signed)
Assumed care of patient. Patient is alert and oriented and is ambulatory or ambulatory with minimal assistance. Patient is evaluated by mid-level provider in the JET express procedure of the Emergency Department. The Patient is made aware this nurse is available for any assistance at any point of the JET express process. Family and/or caregiver is encouraged to be present.     Focus Note: Patient c/o left eye swelling and discharge x 2 weeks. Patient c/o bilateral pain secondary to heavy lifting (per patient)      Physical Assessment:    Neuro: WDL  Cardiac: WDL  Resp: WDL  PVS: WDL  GI/GU: WDL  Skin: WDL  ENT: left eye swelling and drainage  Musculoskeletal: groin pain

## 2017-05-21 NOTE — ED Notes (Signed)
Discharge instructions provided to patient by PA/MD. VSS. Patient refuses wheelchair and ambulatroy to discharge with steady gait.

## 2017-05-21 NOTE — ED Provider Notes (Signed)
EMERGENCY DEPARTMENT HISTORY AND PHYSICAL EXAM      Date: 05/21/2017  Patient Name: Tara Hogan    PROVIDER IN TRIAGE NOTE:  1:00 PM  Blenda Nicely, PA-C has evaluated the patient as the Provider in Triage (PIT) for eye pain and bilateral leg/groin pain. The vital signs and the triage nurse assessment have been reviewed. The patient and any available family have been advised that the appropriate studies have been ordered to initiate the work up based on the clinical presentation during the assessment. The pt has been advised that they will be accommodated in express care as soon as possible. The pt has been requested to contact the triage nurse or PIT immediately if they experiences any changes in their condition during this brief waiting period.       History of Presenting Illness     Chief Complaint   Patient presents with   ??? Groin Pain     pt reports bilateral thigh / groin pain, pt thinks she may have pulled something at work   ??? Eye Pain     for couple weeks- swelling discharge       History Provided By: Patient    HPI: Tara Hogan, 61 y.o. female with PMHx significant for anxiety and tobacco abuse, presents ambulatory to the ED with cc of groin pain x 1 week. Pt states she thinks she pulled it at work. She works at [employer] and she performs heavy lifting regularly. Pt states that her pain is worse at night when she is trying to rest. She describes her pain as aching. She has tried taking tylenol with moderate relief in her symptoms. She last took tylenol two yesterday morning. Pt reports she has been ambulatory since pain onset. Of note, she also reports L eye swelling with "white stuff in the corner." Pt has been using Visine with intermittent relief. Pt went to [facility] ED and was written for drops but she has not yet filled it.  Deneis any recent injury or falls, injuries.     There are no other complaints, changes, or physical findings at this time.    PCP: Lidia Collum, MD     Current Outpatient Prescriptions   Medication Sig Dispense Refill   ??? methocarbamol (ROBAXIN) 500 mg tablet Take 1 Tab by mouth four (4) times daily. 20 Tab 0   ??? erythromycin (ILOTYCIN) ophthalmic ointment Apply three times daily to affected eye 1 g 0   ??? ketotifen (ZADITOR) 0.025 % (0.035 %) ophthalmic solution Administer 1 Drop to both eyes two (2) times a day for 10 days. 5 mL 0       Past History     Past Medical History:  No past medical history on file.    Past Surgical History:  No past surgical history on file.    Family History:  No family history on file.    Social History:  Social History   Substance Use Topics   ??? Smoking status: Current Every Day Smoker     Packs/day: 1.00     Types: Cigarettes   ??? Smokeless tobacco: Never Used   ??? Alcohol use No       Allergies:  No Known Allergies      Review of Systems   Review of Systems   Constitutional: Negative.  Negative for activity change, appetite change, chills and fever.   HENT: Negative for rhinorrhea and sore throat.    Eyes: Positive for discharge and  itching. Negative for photophobia, pain, redness and visual disturbance.   Respiratory: Negative for cough, shortness of breath and wheezing.    Cardiovascular: Negative for chest pain, palpitations and leg swelling.   Gastrointestinal: Negative for abdominal pain, diarrhea, nausea and vomiting.   Genitourinary: Negative for dysuria and hematuria.   Musculoskeletal: Positive for myalgias. Negative for arthralgias.   Skin: Negative for color change, rash and wound.   Neurological: Negative for dizziness and headaches.   All other systems reviewed and are negative.      Physical Exam   Physical Exam   Constitutional: She is oriented to person, place, and time. Vital signs are normal. She appears well-developed and well-nourished. No distress.   61 y.o. African American female in NAD  Communicates appropriately and in full sentences   HENT:   Head: Normocephalic and atraumatic.    Mouth/Throat: No oropharyngeal exudate.   Eyes: Conjunctivae are normal. Pupils are equal, round, and reactive to light. Right eye exhibits no discharge. Left eye exhibits no discharge.   No conjunctival erythema or irritation  Cobblestoning to lower L eyelid   Neck: Normal range of motion. Neck supple.   No nuchal rigidity or meningeal signs   Pulmonary/Chest: Effort normal. No respiratory distress.   Musculoskeletal: Normal range of motion. She exhibits no edema, tenderness or deformity.   No neurologic, motor, vascular, or compartment embarrassment observed on exam. No focal neurologic deficits.  Mild tenderness to quadriceps with no overlying skin changes  NVi distally  Ambulatory without assistance   Neurological: She is alert and oriented to person, place, and time.   Skin: Skin is warm and dry. No rash noted. She is not diaphoretic. No erythema. No pallor.   Psychiatric: She has a normal mood and affect. Her behavior is normal.   Nursing note and vitals reviewed.      Diagnostic Study Results     No formal testing initiated    Medical Decision Making   I am the first provider for this patient.    I reviewed the vital signs, available nursing notes, past medical history, past surgical history, family history and social history.    Vital Signs-Reviewed the patient's vital signs.  Patient Vitals for the past 12 hrs:   Temp Pulse Resp BP SpO2   05/21/17 1259 97.4 ??F (36.3 ??C) 80 16 118/87 100 %       Records Reviewed: Nursing Notes and Old Medical Records    Provider Notes (Medical Decision Making):   DDx: strain, sprain, contusion, spasm; conjunctivitis, viral illness    No vision changes. Eye does not appear to be acutely infected. Pt insistent on receiving ABX for her eye.    ED Course:   Initial assessment performed. The patients presenting problems have been discussed, and they are in agreement with the care plan formulated and outlined with them.  I have encouraged them to ask questions as they arise  throughout their visit.    TOBACCO COUNSELING:  Upon evaluation, pt expressed that they are a current tobacco user. Pt has been counseled on the dangers of smoking and was encouraged to quit as soon as possible in order to decrease further risks to their health. Pt has conveyed their understanding of the risks involved should they continue to use tobacco products.    PLAN:  1.   Discharge Medication List as of 05/21/2017  1:54 PM      START taking these medications    Details   methocarbamol (ROBAXIN) 500  mg tablet Take 1 Tab by mouth four (4) times daily., Normal, Disp-20 Tab, R-0      erythromycin (ILOTYCIN) ophthalmic ointment Apply three times daily to affected eye, Normal, Disp-1 g, R-0      ketotifen (ZADITOR) 0.025 % (0.035 %) ophthalmic solution Administer 1 Drop to both eyes two (2) times a day for 10 days., Normal, Disp-5 mL, R-0           2.   Follow-up Information     Follow up With Details Comments Contact Info    Lidia Collum, MD Schedule an appointment as soon as possible for a visit in 2 days As needed, If symptoms worsen, Possible further evaluation and treatment [address]  [place] [address]  [phone]      [facility] EMERGENCY DEPT Go to As needed, If symptoms worsen [address]  [place] [address]  [phone]    Othelia Pulling, MD Call today  [address]  [address]  [place] [address]  [phone]          Return to ED if worse     Diagnosis     Clinical Impression:   1. Muscle strain of thigh, unspecified laterality, initial encounter    2. Irritation of eye    3. Allergic conjunctivitis of left eye    4. Anxiety state      This note will not be viewable in MyChart.

## 2017-06-24 ENCOUNTER — Inpatient Hospital Stay
Admit: 2017-06-24 | Discharge: 2017-06-24 | Disposition: A | Discharge status: Home / Self Care | Payer: Self-pay | Attending: Emergency Medicine

## 2017-06-24 DIAGNOSIS — M25512 Pain in left shoulder: Secondary | ICD-10-CM

## 2017-06-24 MED ORDER — IBUPROFEN 800 MG TAB
800 mg | ORAL_TABLET | Freq: Three times a day (TID) | ORAL | 0 refills | Status: AC | PRN
Start: 2017-06-24 — End: 2017-07-01

## 2017-06-24 MED ORDER — KETOTIFEN 0.025 % (0.035 %) EYE DROPS
0.025 % (0.035 %) | Freq: Two times a day (BID) | OPHTHALMIC | 0 refills | Status: AC
Start: 2017-06-24 — End: 2017-07-04

## 2017-06-24 NOTE — ED Provider Notes (Signed)
EMERGENCY DEPARTMENT HISTORY AND PHYSICAL EXAM      Date: 06/24/2017  Patient Name: Tara LukeCheryl D Madej    History of Presenting Illness     Chief Complaint   Patient presents with   ??? Eye Pain     left eye pain x 5 months; pt reports "she was seen for this before and it still is hurting"   ??? Shoulder Pain     left shoulder pain that has been "hurting for years with LROM       History Provided By: Patient    HPI: Tara Lukeheryl D Hogan, 61 y.o. female presents ambulatory to the ED with cc of   1) many months of 5 out of 10 constant aching left shoulder pain that is worse with overhead movement. She has had this problem for years and would get steroid injections years ago.     2) many weeks of left eye irritation. Has been seen previously for this problem. She does not wear contact lenses. She tells me "the other lady's tryin' to tell me it's allergies but I know it's the mold at work. Can you just give me a note to say it's the mold at work". 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 No vision changes. No headache. No eye pain.     Chief Complaint: left shoulder pain  Duration: many Months  Timing:  Constant  Location: left shoulder  Quality: Aching  Severity: 5 out of 10  Modifying Factors: worse with overhead activity  Associated Symptoms: denies any other associated signs or symptoms    There are no other complaints, changes, or physical findings at this time.    PCP: Lidia CollumMark J Rosenberg, MD    Current Outpatient Prescriptions   Medication Sig Dispense Refill   ??? ibuprofen (MOTRIN) 800 mg tablet Take 1 Tab by mouth every eight (8) hours as needed for Pain for up to 7 days. 20 Tab 0   ??? ketotifen (ZADITOR) 0.025 % (0.035 %) ophthalmic solution Administer 1 Drop to both eyes two (2) times a day for 10 days. 1 Bottle 0   ??? methocarbamol (ROBAXIN) 500 mg tablet Take 1 Tab by mouth four (4) times  daily. 20 Tab 0       Past History     Past Medical History:  No past medical history on file.    Past Surgical History:  No past surgical history on file.    Family History:  No family history on file.    Social History:  Social History   Substance Use Topics   ??? Smoking status: Current Every Day Smoker     Packs/day: 1.00     Types: Cigarettes   ??? Smokeless tobacco: Never Used   ??? Alcohol use No       Allergies:  No Known Allergies      Review of Systems   Review of Systems   Constitutional: Negative for fatigue and fever.   HENT: Negative for ear pain and sore throat.    Eyes: Positive for pain. Negative for redness and visual disturbance.   Respiratory: Negative for cough and shortness of breath.    Cardiovascular: Negative for chest pain and palpitations.   Gastrointestinal: Negative for abdominal pain, nausea and vomiting.   Genitourinary: Negative for dysuria, frequency and urgency.   Musculoskeletal: Negative for back pain, gait  problem, neck pain and neck stiffness.        Left shoulder pain   Skin: Negative for rash and wound.   Neurological: Negative for dizziness, weakness, light-headedness, numbness and headaches.       Physical Exam   Physical Exam   Constitutional: She is oriented to person, place, and time. She appears well-developed and well-nourished.  Non-toxic appearance. No distress.   HENT:   Head: Normocephalic and atraumatic.   Right Ear: External ear normal.   Left Ear: External ear normal.   Nose: Nose normal.   Mouth/Throat: Uvula is midline. No trismus in the jaw.   Eyes: Conjunctivae and EOM are normal. Pupils are equal, round, and reactive to light. No scleral icterus.   LEFT EYE:  No periorbital edema or erythema  No conjunctival injection  Sclera are white  PERRL  EOMI   Neck: Normal range of motion and full passive range of motion without pain.   Cardiovascular: Normal rate and regular rhythm.    Pulmonary/Chest: Effort normal. No accessory muscle usage. No tachypnea.  No respiratory distress. She has no decreased breath sounds. She has no wheezes.   Abdominal: Soft. There is no tenderness.   Musculoskeletal: Normal range of motion.        Left shoulder: She exhibits tenderness.   LEFT SHOULDER:  Good symmetry  No bruising, redness or swelling  Essentially FAROM with a painful arc  Diffuse tenderness     Neurological: She is alert and oriented to person, place, and time. She is not disoriented. No cranial nerve deficit. GCS eye subscore is 4. GCS verbal subscore is 5. GCS motor subscore is 6.   Skin: Skin is intact. No rash noted.   Psychiatric: She has a normal mood and affect. Her speech is normal.   Nursing note and vitals reviewed.      Diagnostic Study Results     Labs -   No results found for this or any previous visit (from the past 12 hour(s)).    Radiologic Studies -   No orders to display     CT Results  (Last 48 hours)    None        CXR Results  (Last 48 hours)    None            Medical Decision Making   I am the first provider for this patient.    I reviewed the vital signs, available nursing notes, past medical history, past surgical history, family history and social history.    Vital Signs-Reviewed the patient's vital signs.  Patient Vitals for the past 12 hrs:   Temp Pulse Resp BP SpO2   06/24/17 0925 98.1 ??F (36.7 ??C) 83 16 103/64 99 %     Records Reviewed: Nursing Notes and Old Medical Records    Provider Notes (Medical Decision Making):   DDx includes subacromial bursitis, ACJ strain, impingement syndrome, rotator cuff strain/tear, arthritis and other causes of mechanical shoulder pain.     --given chronicity and the absence of injury, will defer imaging    Regarding her eye, this appears to be a chronic problem without vision loss or worrisome symptoms. Will refer to Ophthalmology    ED Course:   Initial assessment performed. The patients presenting problems have been discussed, and they are in agreement with the care plan formulated and  outlined with them.  I have encouraged them to ask questions as they arise throughout their visit.    Disposition:  Discharge    PLAN:  1.   Current Discharge Medication List      START taking these medications    Details   ibuprofen (MOTRIN) 800 mg tablet Take 1 Tab by mouth every eight (8) hours as needed for Pain for up to 7 days.  Qty: 20 Tab, Refills: 0      ketotifen (ZADITOR) 0.025 % (0.035 %) ophthalmic solution Administer 1 Drop to both eyes two (2) times a day for 10 days.  Qty: 1 Bottle, Refills: 0         CONTINUE these medications which have NOT CHANGED    Details   methocarbamol (ROBAXIN) 500 mg tablet Take 1 Tab by mouth four (4) times daily.  Qty: 20 Tab, Refills: 0           2.   Follow-up Information     Follow up With Details Comments Contact Info    Lidia CollumMark J Rosenberg, MD Schedule an appointment as soon as possible for a visit PRIMARY CARE: call to schedule follow up [address]  [place] [id number]  [phone]      Manon Hildinghorp J Davis, MD Schedule an appointment as soon as possible for a visit ORTHO: regarding your chronic left shoulder pain [address]  [address]  [place] [id number]  [phone]      [facility] Schedule an appointment as soon as possible for a visit EYE DOCTOR: regarding your chronic left eye irritation [address]  [place] [id number]  [phone]        Return to ED if worse     Diagnosis     Clinical Impression:   1. Acute pain of left shoulder    2. Irritation of left eye

## 2017-06-24 NOTE — ED Notes (Signed)
Patient identified, read over and explained discharge instructions with time for questions and answers by attending MD/PA.?? Patient has verbalized understanding of discharge instructions. Ambulatory to go home with written paperwork in hand.

## 2017-08-02 ENCOUNTER — Emergency Department

## 2017-08-02 ENCOUNTER — Inpatient Hospital Stay
Admit: 2017-08-02 | Discharge: 2017-08-02 | Disposition: A | Discharge status: Home / Self Care | Payer: Self-pay | Attending: Emergency Medicine

## 2017-08-02 ENCOUNTER — Emergency Department: Admit: 2017-08-02 | Payer: Self-pay | Primary: Student in an Organized Health Care Education/Training Program

## 2017-08-02 DIAGNOSIS — N12 Tubulo-interstitial nephritis, not specified as acute or chronic: Secondary | ICD-10-CM

## 2017-08-02 LAB — CBC WITH AUTOMATED DIFF
ABS. BASOPHILS: 0.1 10*3/uL (ref 0.0–0.1)
ABS. EOSINOPHILS: 0.2 10*3/uL (ref 0.0–0.4)
ABS. IMM. GRANS.: 0 10*3/uL (ref 0.00–0.04)
ABS. LYMPHOCYTES: 1.8 10*3/uL (ref 0.8–3.5)
ABS. MONOCYTES: 0.6 10*3/uL (ref 0.0–1.0)
ABS. NEUTROPHILS: 5.3 10*3/uL (ref 1.8–8.0)
ABSOLUTE NRBC: 0 10*3/uL (ref 0.00–0.01)
BASOPHILS: 1 % (ref 0–1)
EOSINOPHILS: 2 % (ref 0–7)
HCT: 43.4 % (ref 35.0–47.0)
HGB: 14.3 g/dL (ref 11.5–16.0)
IMMATURE GRANULOCYTES: 0 % (ref 0.0–0.5)
LYMPHOCYTES: 23 % (ref 12–49)
MCH: 30.8 PG (ref 26.0–34.0)
MCHC: 32.9 g/dL (ref 30.0–36.5)
MCV: 93.3 FL (ref 80.0–99.0)
MONOCYTES: 8 % (ref 5–13)
MPV: 10.6 FL (ref 8.9–12.9)
NEUTROPHILS: 67 % (ref 32–75)
NRBC: 0 PER 100 WBC
PLATELET: 176 10*3/uL (ref 150–400)
RBC: 4.65 M/uL (ref 3.80–5.20)
RDW: 13.5 % (ref 11.5–14.5)
WBC: 8 10*3/uL (ref 3.6–11.0)

## 2017-08-02 LAB — URINALYSIS W/ REFLEX CULTURE
Bilirubin: NEGATIVE
Glucose: NEGATIVE mg/dL
Ketone: NEGATIVE mg/dL
Nitrites: NEGATIVE
Protein: NEGATIVE mg/dL
Specific gravity: 1.014 (ref 1.003–1.030)
Urobilinogen: 1 EU/dL (ref 0.2–1.0)
WBC: >100 /hpf — ABNORMAL HIGH (ref 0–4)
pH (UA): 6.5 (ref 5.0–8.0)

## 2017-08-02 LAB — METABOLIC PANEL, COMPREHENSIVE
A-G Ratio: 0.6 — ABNORMAL LOW (ref 1.1–2.2)
ALT (SGPT): 18 U/L (ref 12–78)
AST (SGOT): 23 U/L (ref 15–37)
Albumin: 3 g/dL — ABNORMAL LOW (ref 3.5–5.0)
Alk. phosphatase: 68 U/L (ref 45–117)
Anion gap: 4 mmol/L — ABNORMAL LOW (ref 5–15)
BUN/Creatinine ratio: 9 — ABNORMAL LOW (ref 12–20)
BUN: 9 MG/DL (ref 6–20)
Bilirubin, total: 0.4 MG/DL (ref 0.2–1.0)
CO2: 29 mmol/L (ref 21–32)
Calcium: 9 MG/DL (ref 8.5–10.1)
Chloride: 106 mmol/L (ref 97–108)
Creatinine: 0.98 MG/DL (ref 0.55–1.02)
GFR est AA: >60 mL/min/{1.73_m2} (ref >60)
GFR est non-AA: 58 mL/min/{1.73_m2} — ABNORMAL LOW (ref >60)
Globulin: 5.2 g/dL — ABNORMAL HIGH (ref 2.0–4.0)
Glucose: 83 mg/dL (ref 65–100)
Potassium: 4.5 mmol/L (ref 3.5–5.1)
Protein, total: 8.2 g/dL (ref 6.4–8.2)
Sodium: 139 mmol/L (ref 136–145)

## 2017-08-02 LAB — LIPASE: Lipase: 98 U/L (ref 73–393)

## 2017-08-02 LAB — TROPONIN I: Troponin-I, Qt.: <0.05 ng/mL (ref <0.05)

## 2017-08-02 MED ORDER — CEFUROXIME AXETIL 500 MG TAB
500 mg | ORAL_TABLET | Freq: Two times a day (BID) | ORAL | 0 refills | Status: AC
Start: 2017-08-02 — End: 2017-08-12

## 2017-08-02 MED ORDER — ADV ADDAPTOR
1 gram | Status: DC
Start: 2017-08-02 — End: 2017-08-02

## 2017-08-02 MED ORDER — LIDOCAINE (PF) 10 MG/ML (1 %) IJ SOLN
10 mg/mL (1 %) | INTRAMUSCULAR | Status: DC
Start: 2017-08-02 — End: 2017-08-02

## 2017-08-02 MED ORDER — CEFTRIAXONE 1 GRAM SOLUTION FOR INJECTION
1 gram | INTRAMUSCULAR | Status: DC
Start: 2017-08-02 — End: 2017-08-02

## 2017-08-02 MED ORDER — SODIUM CHLORIDE 0.9 % IJ SYRG
INTRAMUSCULAR | Status: DC | PRN
Start: 2017-08-02 — End: 2017-08-02

## 2017-08-02 MED ORDER — CEFTRIAXONE 1 GRAM SOLUTION FOR INJECTION
1 gram | Freq: Once | INTRAMUSCULAR | Status: AC
Start: 2017-08-02 — End: 2017-08-02
  Administered 2017-08-02: 17:00:00 via INTRAMUSCULAR

## 2017-08-02 MED ORDER — PROMETHAZINE 25 MG TAB
25 mg | ORAL_TABLET | Freq: Four times a day (QID) | ORAL | 0 refills | Status: AC | PRN
Start: 2017-08-02 — End: ?

## 2017-08-02 MED ORDER — HYDROCODONE-ACETAMINOPHEN 5 MG-325 MG TAB
5-325 mg | ORAL_TABLET | ORAL | 0 refills | Status: AC | PRN
Start: 2017-08-02 — End: ?

## 2017-08-02 MED ORDER — SODIUM CHLORIDE 0.9 % IJ SYRG
Freq: Three times a day (TID) | INTRAMUSCULAR | Status: DC
Start: 2017-08-02 — End: 2017-08-02

## 2017-08-02 MED FILL — CEFTRIAXONE 1 GRAM SOLUTION FOR INJECTION: 1 gram | INTRAMUSCULAR | Qty: 1

## 2017-08-02 NOTE — ED Notes (Addendum)
Bedside shift change report given to Olivia, SN & Melanie, RN (oncoming nurse) by Jessica, RN (offgoing nurse). Report included the following information SBAR, ED Summary, MAR and Recent Results.

## 2017-08-02 NOTE — ED Notes (Addendum)
Pt provided with meal tray. Dr. Harrie JeansStratton at bedside discussing discharge instructions with pt. Pt to be discharged following treatment.

## 2017-08-02 NOTE — ED Notes (Addendum)
Pt resting on stretcher at this time. Pt updated on plan of care. Call bell in reach.

## 2017-08-02 NOTE — ED Provider Notes (Signed)
EMERGENCY DEPARTMENT HISTORY AND PHYSICAL EXAM      Date: 08/02/2017  Patient Name: Tara Hogan    History of Presenting Illness     Chief Complaint   Patient presents with   ??? Abdominal Pain     pt reports abdominal pain beginning Saturday after eating with nausea, reports cough x2 weeks   ??? Cough   ??? Nausea       History Provided By: Patient    HPI: Tara Hogan, 60 y.o. female with no significant pmhx, presents ambulatory to the ED with cc of new onset epigastric abd pain described as fullness and productive cough with associated nausea x 4 days. Pt began coughing ~4 days ago, noting that the force made her feel as though she needed to vomit though she never did. On 9/7, she began with epigastric pain after eating a meal, stating that it feels like her food was "stuck". Pt took OTC gas medication with some relief and notes that drinking a soda allows her to burp, which also provides her relief. Endorses having 2 normal BMs yesterday. Denies hematochezia, melena, fever, chills. Pt drinks alcohol socially and smokes 1ppd. Denies hx of DM, HTN, GI problems, hepatitis, pancreatitis, hiatal hernia, blood clots, hx of kidney stones or renal problems.       There are no other complaints, changes, or physical findings at this time.    PCP: None    Current Facility-Administered Medications   Medication Dose Route Frequency Provider Last Rate Last Dose   ??? sodium chloride (NS) flush 5-10 mL  5-10 mL IntraVENous Q8H Aubriella Perezgarcia Mattie Marlin, MD       ??? sodium chloride (NS) flush 5-10 mL  5-10 mL IntraVENous PRN Alvie Heidelberg, MD         Current Outpatient Prescriptions   Medication Sig Dispense Refill   ??? omega 3-dha-epa-fish oil (FISH OIL) 100-160-1,000 mg cap Take  by mouth.     ??? cefUROXime (CEFTIN) 500 mg tablet Take 1 Tab by mouth two (2) times a day for 10 days. 20 Tab 0   ??? HYDROcodone-acetaminophen (NORCO) 5-325 mg per tablet Take 1 Tab by mouth every four (4) hours as needed for Pain. Max Daily Amount: 6 Tabs.  10 Tab 0   ??? promethazine (PHENERGAN) 25 mg tablet Take 1 Tab by mouth every six (6) hours as needed. 12 Tab 0       Past History     Past Medical History:  History reviewed. No pertinent past medical history.    Past Surgical History:  History reviewed. No pertinent surgical history.    Family History:  History reviewed. No pertinent family history.    Social History:  Social History   Substance Use Topics   ??? Smoking status: Current Every Day Smoker     Packs/day: 1.00     Types: Cigarettes   ??? Smokeless tobacco: Never Used   ??? Alcohol use No       Allergies:  No Known Allergies      Review of Systems   Review of Systems   Constitutional: Negative.  Negative for chills and fever.   HENT: Negative.  Negative for congestion and rhinorrhea.    Respiratory: Positive for cough (productive). Negative for chest tightness and wheezing.    Cardiovascular: Negative.  Negative for chest pain and palpitations.   Gastrointestinal: Positive for abdominal pain (epigastric fullness) and nausea. Negative for blood in stool, constipation and vomiting.   Endocrine: Negative.  Genitourinary: Negative.  Negative for decreased urine volume, flank pain, hematuria and pelvic pain.   Musculoskeletal: Negative.  Negative for back pain and neck pain.   Skin: Negative.  Negative for color change, pallor and rash.   Neurological: Negative.  Negative for dizziness, seizures, weakness, numbness and headaches.   Hematological: Negative.  Negative for adenopathy.   Psychiatric/Behavioral: Negative.    All other systems reviewed and are negative.      Physical Exam   Physical Exam   Constitutional: She is oriented to person, place, and time. She appears well-developed and well-nourished. No distress.   HENT:   Head: Normocephalic and atraumatic.   Mouth/Throat: No oropharyngeal exudate.   Eyes: Conjunctivae are normal. Pupils are equal, round, and reactive to light. Right eye exhibits no discharge. Left eye exhibits no discharge. No  scleral icterus.   Neck: Normal range of motion. Neck supple. No JVD present.   Cardiovascular: Normal rate, regular rhythm, normal heart sounds and intact distal pulses.  Exam reveals no gallop and no friction rub.    No murmur heard.  Pulmonary/Chest: Effort normal and breath sounds normal. No stridor. No respiratory distress. She has no wheezes. She has no rales. She exhibits no tenderness.   Abdominal: Soft. Normal aorta and bowel sounds are normal. She exhibits no distension, no pulsatile midline mass and no mass. There is no hepatosplenomegaly. There is tenderness in the left lower quadrant. There is no rigidity, no rebound, no guarding, no CVA tenderness, no tenderness at McBurney's point and negative Murphy's sign. No hernia.       Neurological: She is alert and oriented to person, place, and time. She displays normal reflexes. No cranial nerve deficit. She exhibits normal muscle tone. Coordination normal.   Skin: Skin is warm. No rash noted. She is not diaphoretic. No pallor.   Vitals reviewed.      Diagnostic Study Results     Labs -     Recent Results (from the past 12 hour(s))   CBC WITH AUTOMATED DIFF    Collection Time: 08/02/17  9:28 AM   Result Value Ref Range    WBC 8.0 3.6 - 11.0 K/uL    RBC 4.65 3.80 - 5.20 M/uL    HGB 14.3 11.5 - 16.0 g/dL    HCT 43.4 35.0 - 47.0 %    MCV 93.3 80.0 - 99.0 FL    MCH 30.8 26.0 - 34.0 PG    MCHC 32.9 30.0 - 36.5 g/dL    RDW 13.5 11.5 - 14.5 %    PLATELET 176 150 - 400 K/uL    MPV 10.6 8.9 - 12.9 FL    NRBC 0.0 0 PER 100 WBC    ABSOLUTE NRBC 0.00 0.00 - 0.01 K/uL    NEUTROPHILS 67 32 - 75 %    LYMPHOCYTES 23 12 - 49 %    MONOCYTES 8 5 - 13 %    EOSINOPHILS 2 0 - 7 %    BASOPHILS 1 0 - 1 %    IMMATURE GRANULOCYTES 0 0.0 - 0.5 %    ABS. NEUTROPHILS 5.3 1.8 - 8.0 K/UL    ABS. LYMPHOCYTES 1.8 0.8 - 3.5 K/UL    ABS. MONOCYTES 0.6 0.0 - 1.0 K/UL    ABS. EOSINOPHILS 0.2 0.0 - 0.4 K/UL    ABS. BASOPHILS 0.1 0.0 - 0.1 K/UL    ABS. IMM. GRANS. 0.0 0.00 - 0.04 K/UL     DF AUTOMATED     METABOLIC  PANEL, COMPREHENSIVE    Collection Time: 08/02/17  9:28 AM   Result Value Ref Range    Sodium 139 136 - 145 mmol/L    Potassium 4.5 3.5 - 5.1 mmol/L    Chloride 106 97 - 108 mmol/L    CO2 29 21 - 32 mmol/L    Anion gap 4 (L) 5 - 15 mmol/L    Glucose 83 65 - 100 mg/dL    BUN 9 6 - 20 MG/DL    Creatinine 0.98 0.55 - 1.02 MG/DL    BUN/Creatinine ratio 9 (L) 12 - 20      GFR est AA >60 >60 ml/min/1.35m    GFR est non-AA 58 (L) >60 ml/min/1.759m   Calcium 9.0 8.5 - 10.1 MG/DL    Bilirubin, total 0.4 0.2 - 1.0 MG/DL    ALT (SGPT) 18 12 - 78 U/L    AST (SGOT) 23 15 - 37 U/L    Alk. phosphatase 68 45 - 117 U/L    Protein, total 8.2 6.4 - 8.2 g/dL    Albumin 3.0 (L) 3.5 - 5.0 g/dL    Globulin 5.2 (H) 2.0 - 4.0 g/dL    A-G Ratio 0.6 (L) 1.1 - 2.2     LIPASE    Collection Time: 08/02/17  9:28 AM   Result Value Ref Range    Lipase 98 73 - 393 U/L   URINALYSIS W/ REFLEX CULTURE    Collection Time: 08/02/17  9:28 AM   Result Value Ref Range    Color YELLOW/STRAW      Appearance CLOUDY (A) CLEAR      Specific gravity 1.014 1.003 - 1.030      pH (UA) 6.5 5.0 - 8.0      Protein NEGATIVE  NEG mg/dL    Glucose NEGATIVE  NEG mg/dL    Ketone NEGATIVE  NEG mg/dL    Bilirubin NEGATIVE  NEG      Blood TRACE (A) NEG      Urobilinogen 1.0 0.2 - 1.0 EU/dL    Nitrites NEGATIVE  NEG      Leukocyte Esterase LARGE (A) NEG      WBC >100 (H) 0 - 4 /hpf    RBC 5-10 0 - 5 /hpf    Epithelial cells FEW FEW /lpf    Bacteria 1+ (A) NEG /hpf    UA:UC IF INDICATED URINE CULTURE ORDERED (A) CNI      Hyaline cast 2-5 0 - 5 /lpf   TROPONIN I    Collection Time: 08/02/17  9:28 AM   Result Value Ref Range    Troponin-I, Qt. <0.05 <0.05 ng/mL   EKG, 12 LEAD, INITIAL    Collection Time: 08/02/17  9:29 AM   Result Value Ref Range    Ventricular Rate 72 BPM    Atrial Rate 72 BPM    P-R Interval 132 ms    QRS Duration 94 ms    Q-T Interval 382 ms    QTC Calculation (Bezet) 418 ms    Calculated P Axis 81 degrees     Calculated R Axis 91 degrees    Calculated T Axis 36 degrees    Diagnosis       Normal sinus rhythm  Rightward axis  No previous ECGs available         Radiologic Studies -   CT ABD PELV WO CONT   Final Result      XR CHEST PA LAT   Final Result  CT Results  (Last 48 hours)               08/02/17 1113  CT ABD PELV WO CONT Final result    Impression:  IMPRESSION:    There is mild left hydroureteronephrosis, but no obstructing calculus is   demonstrated. There are 2 small nonobstructing left renal calculi. Differential   considerations include a recently passed calculus or infection. Correlate with   urinalysis. If the patient's symptoms do not improve after an appropriate   clinical interval, consider further evaluation with CT without and with   contrast.               Narrative:  EXAM:  CT abdomen pelvis without contrast       INDICATION: Flank pain.       COMPARISON: None.       TECHNIQUE: Helical CT of the abdomen  and pelvis  without contrast. Coronal and   sagittal reformats are performed. CT dose reduction was achieved through use of   a standardized protocol tailored for this examination and automatic exposure   control for dose modulation.       FINDINGS:    Solid organ evaluation is limited without contrast.        The visualized lung bases demonstrate no mass or consolidation. The heart size   is normal. There is no pericardial or pleural effusion.       There are 2 small nonobstructing left renal calculi. There is no right renal,   ureteral, or urinary bladder calculus. There is mild left hydroureteronephrosis   and mild left perinephric fluid and stranding. There is no right hydronephrosis.   The liver, spleen, pancreas, and adrenal glands are normal.  The gall bladder is   present  without intra- or extra-hepatic biliary dilatation.         There are no dilated bowel loops.  The appendix is normal.         There are no enlarged lymph nodes.  There is no free fluid or free air. The    aorta tapers without aneurysm. There is aortoiliac atherosclerosis.       The urinary bladder is unremarkable. There is no pelvic mass.         The bony structures are age-appropriate.               CXR Results  (Last 48 hours)               08/02/17 1009  XR CHEST PA LAT Final result    Impression:  IMPRESSION:   1. The lungs are mildly hyperinflated but clear of an acute process       Narrative:  Exam:  2 view chest       Indication: Abdominal pain, cough and nausea.       COMPARISON: None       PA and lateral views demonstrate normal heart size.       The lungs are mildly hyperinflated. The lungs are clear acute process. No   adenopathy.       There are degenerative changes of the thoracic spine.                   Medical Decision Making   I am the first provider for this patient.    I reviewed the vital signs, available nursing notes, past medical history, past surgical history, family history and social history.    Vital Signs-Reviewed the patient's vital  signs.  Patient Vitals for the past 12 hrs:   Temp Pulse Resp BP SpO2   08/02/17 1245 - - - 113/85 96 %   08/02/17 1215 - - - 122/73 96 %   08/02/17 1145 - - - 121/63 96 %   08/02/17 1100 - - - 129/69 94 %   08/02/17 1035 - - - 113/83 95 %   08/02/17 1003 - - - 106/68 97 %   08/02/17 0911 98.1 ??F (36.7 ??C) 83 16 104/70 98 %       Records Reviewed: Nursing Notes and Old Medical Records     EKG interpretation: (Preliminary) [phone]  Rhythm: normal sinus rhythm; and regular . Rate (approx.): 72 bpm; Axis: rightward axis; PR interval: normal; QRS interval: normal ; ST/T wave: normal.  Written by Halina Maidens ED Scribe as dictated by Alvie Heidelberg, MD    Provider Notes (Medical Decision Making):   DDx: PNA, PUD, hepatitis, pancreatitis, coronary syndrome, biliary colic, AAA    Impression Plan: Hx of tobacco abuse, to the ER with initial cough with possible post-tussive nausea. Since then has developed epigastric fullness  and upper abd pain. Social drinker. Plan will be obtain labs, XR. If normal, may need [place] to evaluate for biliary process.     ED Course:   Initial assessment performed. The patients presenting problems have been discussed, and they are in agreement with the care plan formulated and outlined with them.  I have encouraged them to ask questions as they arise throughout their visit.    CONSULT NOTE:   12:29 PM  Alvie Heidelberg, MD spoke with Dr. Artis Delay,   Specialty: Urology  Discussed pt's hx, disposition, and available diagnostic and imaging results. Reviewed care plans.  Consultant agrees with abx, would like her to f/u in office in 2 weeks.   Written by Halina Maidens, ED Scribe, as dictated by Alvie Heidelberg, MD.      Disposition:  DISCHARGE NOTE  1:13 PM  The patient has been re-evaluated and is ready for discharge. Reviewed available results with patient. Counseled pt on diagnosis and care plan. Pt has expressed understanding, and all questions have been answered. Pt agrees with plan and agrees to F/U as recommended, or return to the ED if their sxs worsen. Discharge instructions have been provided and explained to the pt, along with reasons to return to the ED.  Written by Halina Maidens, ED Scribe, as dictated by Alvie Heidelberg, MD.    PLAN:  1.   Discharge Medication List as of 08/02/2017  1:07 PM      START taking these medications    Details   cefUROXime (CEFTIN) 500 mg tablet Take 1 Tab by mouth two (2) times a day for 10 days., Print, Disp-20 Tab, R-0      HYDROcodone-acetaminophen (NORCO) 5-325 mg per tablet Take 1 Tab by mouth every four (4) hours as needed for Pain. Max Daily Amount: 6 Tabs., Print, Disp-10 Tab, R-0      promethazine (PHENERGAN) 25 mg tablet Take 1 Tab by mouth every six (6) hours as needed., Print, Disp-12 Tab, R-0         CONTINUE these medications which have NOT CHANGED    Details   omega 3-dha-epa-fish oil (FISH OIL) 100-160-1,000 mg cap Take  by mouth.,  Historical Med           2.   Follow-up Information     Follow up With Details  Comments Contact Info    Bonney Aid, MD Schedule an appointment as soon as possible for a visit in 1 week  [address]  [address]  [place] [address]  [phone]      [facility]   [address]  [place]  [phone]    [facility]  If symptoms worsen [address]  [place]  [phone]        Return to ED if worse     Diagnosis     Clinical Impression:   1. Pyelonephritis        Attestations:    This note is prepared by Eulah Citizen Hutcherson acting as scribe for Alvie Heidelberg, MD    Alvie Heidelberg, MD : The scribe's documentation has been prepared under my direction and personally reviewed by me in its entirety. I confirm that the note above accurately reflects all work, treatment, procedures, and medical decision making performed by me.

## 2017-08-02 NOTE — ED Notes (Signed)
Dr. Stratton at bedside

## 2017-08-02 NOTE — Progress Notes (Signed)
Ceftin.

## 2017-08-03 LAB — EKG, 12 LEAD, INITIAL
Atrial Rate: 72 {beats}/min
Calculated P Axis: 81 degrees
Calculated R Axis: 91 degrees
Calculated T Axis: 36 degrees
Diagnosis: NORMAL
P-R Interval: 132 ms
Q-T Interval: 382 ms
QRS Duration: 94 ms
QTC Calculation (Bezet): 418 ms
Ventricular Rate: 72 {beats}/min

## 2017-08-03 LAB — EKG 12-LEAD
Atrial Rate: 72 {beats}/min
Diagnosis: NORMAL
P Axis: 81 degrees
P-R Interval: 132 ms
Q-T Interval: 382 ms
QRS Duration: 94 ms
QTc Calculation (Bazett): 418 ms
R Axis: 91 degrees
T Axis: 36 degrees
Ventricular Rate: 72 {beats}/min

## 2017-08-04 LAB — CULTURE, URINE
Colonies Counted: >100000
Colony Count: >100000

## 2017-10-11 ENCOUNTER — Other Ambulatory Visit: Payer: Self-pay | Admitting: Nurse Practitioner

## 2017-10-11 DIAGNOSIS — Z139 Encounter for screening, unspecified: Secondary | ICD-10-CM

## 2018-08-19 ENCOUNTER — Encounter

## 2018-08-25 ENCOUNTER — Inpatient Hospital Stay
Admit: 2018-08-25 | Payer: PRIVATE HEALTH INSURANCE | Attending: Ophthalmology | Primary: Student in an Organized Health Care Education/Training Program

## 2018-08-25 DIAGNOSIS — H02844 Edema of left upper eyelid: Secondary | ICD-10-CM

## 2018-08-25 MED ORDER — IOPAMIDOL 76 % IV SOLN
370 mg iodine /mL (76 %) | Freq: Once | INTRAVENOUS | Status: AC
Start: 2018-08-25 — End: 2018-08-25
  Administered 2018-08-25: 12:00:00 via INTRAVENOUS

## 2018-08-25 MED ORDER — SODIUM CHLORIDE 0.9 % IJ SYRG
Freq: Once | INTRAMUSCULAR | Status: AC
Start: 2018-08-25 — End: 2018-08-25
  Administered 2018-08-25: 12:00:00 via INTRAVENOUS

## 2018-10-05 ENCOUNTER — Encounter

## 2018-10-15 ENCOUNTER — Inpatient Hospital Stay
Admit: 2018-10-15 | Payer: PRIVATE HEALTH INSURANCE | Attending: Ophthalmology | Primary: Student in an Organized Health Care Education/Training Program

## 2018-10-15 ENCOUNTER — Encounter

## 2018-10-15 DIAGNOSIS — H02841 Edema of right upper eyelid: Secondary | ICD-10-CM

## 2018-10-15 MED ORDER — IOPAMIDOL 76 % IV SOLN
370 mg iodine /mL (76 %) | Freq: Once | INTRAVENOUS | Status: AC
Start: 2018-10-15 — End: 2018-10-15
  Administered 2018-10-15: 17:00:00 via INTRAVENOUS

## 2018-10-15 MED ORDER — IOPAMIDOL 76 % IV SOLN
370 mg iodine /mL (76 %) | INTRAVENOUS | Status: DC
Start: 2018-10-15 — End: 2018-10-15

## 2018-10-15 MED ORDER — IOPAMIDOL 61 % IV SOLN
300 mg iodine /mL (61 %) | Freq: Once | INTRAVENOUS | Status: DC
Start: 2018-10-15 — End: 2018-10-15

## 2018-10-15 MED ORDER — SODIUM CHLORIDE 0.9 % IJ SYRG
Freq: Once | INTRAMUSCULAR | Status: AC
Start: 2018-10-15 — End: 2018-10-15
  Administered 2018-10-15: 17:00:00 via INTRAVENOUS

## 2018-10-15 MED FILL — ISOVUE-370  76 % INTRAVENOUS SOLUTION: 370 mg iodine /mL (76 %) | INTRAVENOUS | Qty: 100

## 2018-11-04 NOTE — Telephone Encounter (Signed)
Confirmed NP appt on 11/04/18, for 11/07/18, also asked patient to pls come in 30 mins early, left voicemail.

## 2018-11-07 ENCOUNTER — Encounter: Attending: Rheumatology | Primary: Student in an Organized Health Care Education/Training Program

## 2018-11-07 NOTE — Telephone Encounter (Signed)
Provider sick 11/07/18, rescheduled patient's appt.

## 2018-11-08 NOTE — Telephone Encounter (Signed)
Confirmed NP appt on 11/08/18, for 11/09/18, also asked patient to pls come in 30 mins early.

## 2018-11-09 ENCOUNTER — Ambulatory Visit: Attending: Rheumatology

## 2018-11-09 ENCOUNTER — Ambulatory Visit
Admit: 2018-11-09 | Payer: PRIVATE HEALTH INSURANCE | Attending: Rheumatology | Primary: Student in an Organized Health Care Education/Training Program

## 2018-11-09 DIAGNOSIS — H209 Unspecified iridocyclitis: Secondary | ICD-10-CM

## 2018-11-09 NOTE — Progress Notes (Signed)
 Tara Hogan is a 62 y.o. female  HIPAA verified by two patient identifiers.   Health Maintenance Due   Topic   . Hepatitis C Screening    . Pneumococcal 0-64 years (1 of 1 - PPSV23)   . DTaP/Tdap/Td series (1 - Tdap)   . Shingrix Vaccine Age 24> (1 of 2)   . FOBT Q 1 YEAR AGE 49-75    . BREAST CANCER SCRN MAMMOGRAM    . PAP AKA CERVICAL CYTOLOGY    . Influenza Age 2 to Adult      Visit Vitals  BP 118/76 (BP 1 Location: Right arm, BP Patient Position: Sitting)   Pulse 79   Temp 98.5 F (36.9 C) (Oral)   Resp 18   Ht 5' 11 (1.803 m)   Wt 226 lb (102.5 kg)   SpO2 97%   BMI 31.52 kg/m       Pain Scale: 1/10  Pain Location:     1. Have you been to the ER, urgent care clinic since your last visit?  Hospitalized since your last visit?No    2. Have you seen or consulted any other health care providers outside of the [facility] since your last visit?  Include any pap smears or colon screening. No

## 2018-11-09 NOTE — Progress Notes (Signed)
REASON FOR VISIT    This is the initial evaluation for Tara Hogan a 62 y.o. African American female for question of joint pain. The patient is referred to the [facility] at the request of Dr. Augusto Garbe.     HISTORY OF PRESENT ILLNESS     Previous medical records reviewed and summarized: yes    mHAQ:0  Pain scale: 1/10    This is a 61 y.o. female with hx of COPD.  The patient notes her left eye and sometimes right eye as well has been swelling.  It feels like there is something in the eye.  She saw her PCP who sent her to eye doctor who then sent her to an eye specialist. She had blood work and 2 CT scans and she was told she might have lupus.  The eye issue has been going on for 1.5 years. She was doing OTC eye drops and they were helping.  The eye doctor advised her to get eye drops as well. She was put on steroids orally by the eye doctor for the eye issue.  She was given prednisone taper.  The prednisone resolved the eye swelling. Then PCP also gave her prednisone taper of 60 mg and it helped with eye swelling.  She saw Dr. Bernette Mayers.  When she wakes up she has left lateral hip and left shoulder pain.  No joint swelling. The pain gets better as she starts moving around. The pain started about 2 weeks ago. She denies getting hurt.    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      REVIEW OF SYSTEMS    A 15 point review of systems was performed and summarized below. The questionnaire was reviewed with the patient and scanned into the patient's medical record.    General: denies recent weight gain, recent weight loss, fatigue, weakness, fever, drenching night sweats  Musculoskeletal: endorses morning stiffness, muscle paindenies joint pain, joint swelling, ,   Ears: denies ringing in ears, hearing loss, deafness  Eyes: endorses light sensitive, redness, double vision, dryness, foreign body sensationdenies pain, blindness,  blurred vision, excess tearing,   Mouth:  denies sore tongue,  oral ulcers, loss of taste, dryness, increased dental caries  Nose: denies nosebleeds, nasal ulcers  Throat: endorses pain in jaw while chewingdenies food stuck when swallowing, difficulty with swallowing, hoarseness,   Neck:  denies swollen glands, tender glands  Cardiopulmonary: endorses shortness of breath on exertion, productive cough, denies pain in chest with deep breaths, pain in chest when lying down, murmurs, sudden changes in heart beat, wheezing, dry cough, shortness of breath at rest, coughing of blood  Gastrointestinal: denies nausea, heartburn, stomach pain relieved by food, chronic constipation, chronic diarrhea, blood in stools, black stools  Genitourinary:  denies vaginal dryness, pain or burning on urination, blood in urine, cloudy urine, vaginal ulcers, penile ulcers  Hematologic: denies anemia, bleeding tendency, blood clots, bleeding gums  Skin:  denies easy bruising, hair loss, rash, rash worsened after sun exposure, hives/urticaria, skin thickening, skin tightness, nodules/bumps, color changes of hands or feet in the cold (Raynaud's)  Neurologic:  denies numbness or tingling in hands, numbness or tingling in feet, muscle weakness  Psychiatric:  denies depression, excessive worries, PTSD, Bipolar  Sleep: endorses daytime somnolence, denies poor sleep (6 hours), denies snoring, apnea, difficulty falling asleep, difficulty staying asleep     PAST MEDICAL HISTORY    Past Medical History:   Diagnosis Date   ???  COPD (chronic obstructive pulmonary disease) ([place])         History reviewed. No pertinent surgical history.    FAMILY HISTORY    History reviewed. No pertinent family history.    SOCIAL HISTORY    Social History     Tobacco Use   ??? Smoking status: Current Every Day Smoker     Packs/day: 1.00     Types: Cigarettes   ??? Smokeless tobacco: Never Used   Substance Use Topics   ??? Alcohol use: No   ??? Drug use: Not on file       MEDICATIONS    Current Outpatient Medications   Medication Sig Dispense  Refill   ??? omega 3-dha-epa-fish oil (FISH OIL) 100-160-1,000 mg cap Take  by mouth.     ??? HYDROcodone-acetaminophen (NORCO) 5-325 mg per tablet Take 1 Tab by mouth every four (4) hours as needed for Pain. Max Daily Amount: 6 Tabs. 10 Tab 0   ??? promethazine (PHENERGAN) 25 mg tablet Take 1 Tab by mouth every six (6) hours as needed. 12 Tab 0       ALLERGIES    No Known Allergies    PHYSICAL EXAMINATION    Visit Vitals  BP 118/76 (BP 1 Location: Right arm, BP Patient Position: Sitting)   Pulse 79   Temp 98.5 ??F (36.9 ??C) (Oral)   Resp 18   Ht '5\' 11"'$  (1.803 m)   Wt 226 lb (102.5 kg)   SpO2 97%   BMI 31.52 kg/m??     Body mass index is 31.52 kg/m??.    General: NAD  HEENT: PERRL, anicteric, mild swelling noted on b/l eye lids.  Some erythema in left eye sclera  Lymphatic: No cervical or axillary lymphadenopathy.  Cardiovascular: S1, S2,no R/M/G  Pulmonary: CTA b/l. No wheezes/rales/rhonchi.  Abdominal: Soft,NTND, + BS.  Skin: No rash, nodules, or periungual changes.  Neuro: Alert; able to carry normal conversation    Musculoskeletal:   Cervical & Lumbar Spine  Neck and spine have no noted deformities or signs of inflammation.  Curvature of cervical, thoracic, and lumbar spine are within normal limits.       Wrist, Hand, & Fingers  No bony deformities, inflammation, or tenderness of bony prominences. No anatomical snuff box tenderness; Full ROM in DIP, PIP, MCP, & carpal joints & with supination and pronation.       Elbow  No bony deformities, inflammation, or tenderness in olecranon, medial, lateral epicondyle elbow. Full ROM upon flexion and extension.       Shoulder  No bony deformities, inflammation, or tenderness in rotator cuff, biceps tendon, or acromioclavicular joint. Full ROM and strength in shoulder upon adduction, abduction, internal and external rotation.      Hip  No bony deformities, inflammation, or tenderness in hip joint.       Knee  FROM of the knee. NO swelling or warmth noted. No bony deformity  noted    Ankle/toes  No bony deformities, inflammation or tenderness    DATA REVIEW    Prior medical records were reviewed and if applicable are summarized as below:    Labs:   N/A    Imaging:   N/A    ASSESSMENT AND PLAN    A 62 y.o. female with hx of COPD presents for evaluation of irritation and swelling around her eyes. The patient was told she had SLE but I do not have any records to confirm this. Further her clinical picture is not very  consistent with SLE. She will need further evaluation to determine the etiology of her symptoms.     # Eye irritation:  - will obtain records from ophtho.   - depending on the results, will see if she needs more blood work and I will then contact he patient to order the blood work.     # COPD:  - advised smoking cessation    RTC in 3 weeks    The patient voiced understanding of the aforementioned assessment and plan. Summary of plan was provided in the After Visit Summary patient instructions. I also provided education about MyChart setup and utility.    Tara Hogan has a reminder for a "due or due soon" health maintenance. I have asked that she contact her primary care provider for follow-up on this health maintenance.    TODAY'S ORDERS    No orders of the defined types were placed in this encounter.      Future Appointments   Date Time Provider [place]   11/30/2018  8:20 AM Tara Florence, MD [place]       Tara Florence, MD    Adult Rheumatology   [facility]  [facility] of [facility]  [address] [place] [address]   Phone [phone]  Fax [phone]    ADDENDUM:   CT orbit (09/2018): normal  07/2018: ANCA, TSH, cbc, BMP, ACE. T. Pallidum, ESR, CRP, TPO, ANA normal, RF positive

## 2018-11-09 NOTE — Addendum Note (Signed)
Addendum  Note by Carnella GuadalajaraAgrawal, Kelse Ploch, MD at 11/09/18 0840                Author: Carnella GuadalajaraAgrawal, Hilmer Aliberti, MD  Service: --  Author Type: Physician       Filed: 11/11/18 1313  Encounter Date: 11/09/2018  Status: Signed          Editor: Carnella GuadalajaraAgrawal, Gyneth Hubka, MD (Physician)          Addended by: Carnella GuadalajaraAGRAWAL, Cinch Ormond on: 11/11/2018 01:13 PM    Modules accepted: Orders

## 2018-11-09 NOTE — Patient Instructions (Signed)
Please fill out your [place] survey you will receive after your visit in the mail or via MyChart!

## 2018-11-09 NOTE — Progress Notes (Signed)
Tara Hogan is a 62 y.o. female  HIPAA verified by two patient identifiers.   Health Maintenance Due   Topic   ??? Hepatitis C Screening    ??? Pneumococcal 0-64 years (1 of 1 - PPSV23)   ??? DTaP/Tdap/Td series (1 - Tdap)   ??? Shingrix Vaccine Age 50> (1 of 2)   ??? FOBT Q 1 YEAR AGE 50-75    ??? BREAST CANCER SCRN MAMMOGRAM    ??? PAP AKA CERVICAL CYTOLOGY    ??? Influenza Age 9 to Adult      Visit Vitals  BP 118/76 (BP 1 Location: Right arm, BP Patient Position: Sitting)   Pulse 79   Temp 98.5 ??F (36.9 ??C) (Oral)   Resp 18   Ht 5' 11" (1.803 m)   Wt 226 lb (102.5 kg)   SpO2 97%   BMI 31.52 kg/m??       Pain Scale: 1/10  Pain Location:     1. Have you been to the ER, urgent care clinic since your last visit?  Hospitalized since your last visit?No    2. Have you seen or consulted any other health care providers outside of the [facility] since your last visit?  Include any pap smears or colon screening. No

## 2018-11-09 NOTE — Addendum Note (Signed)
Addended byCarnella Guadalajara: Tara Hogan on: 11/11/2018 01:13 PM     Modules accepted: Orders

## 2018-11-09 NOTE — Progress Notes (Addendum)
REASON FOR VISIT    This is the initial evaluation for Tara Hogan a 62 y.o. African American female for question of joint pain. The patient is referred to the [facility] at the request of Dr. Augusto Garbe.     HISTORY OF PRESENT ILLNESS     Previous medical records reviewed and summarized: yes    mHAQ:0  Pain scale: 1/10    This is a 62 y.o. female with hx of COPD.  The patient notes her left eye and sometimes right eye as well has been swelling.  It feels like there is something in the eye.  She saw her PCP who sent her to eye doctor who then sent her to an eye specialist. She had blood work and 2 CT scans and she was told she might have lupus.  The eye issue has been going on for 1.5 years. She was doing OTC eye drops and they were helping.  The eye doctor advised her to get eye drops as well. She was put on steroids orally by the eye doctor for the eye issue.  She was given prednisone taper.  The prednisone resolved the eye swelling. Then PCP also gave her prednisone taper of 60 mg and it helped with eye swelling.  She saw Dr. Bernette Mayers.  When she wakes up she has left lateral hip and left shoulder pain.  No joint swelling. The pain gets better as she starts moving around. The pain started about 2 weeks ago. She denies getting hurt.    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      REVIEW OF SYSTEMS    A 15 point review of systems was performed and summarized below. The questionnaire was reviewed with the patient and scanned into the patient's medical record.    General: denies recent weight gain, recent weight loss, fatigue, weakness, fever, drenching night sweats  Musculoskeletal: endorses morning stiffness, muscle paindenies joint pain, joint swelling, ,   Ears: denies ringing in ears, hearing loss, deafness  Eyes: endorses light sensitive, redness, double vision, dryness, foreign body sensationdenies pain, blindness,  blurred vision, excess tearing,    Mouth:  denies sore tongue, oral ulcers, loss of taste, dryness, increased dental caries  Nose: denies nosebleeds, nasal ulcers  Throat: endorses pain in jaw while chewingdenies food stuck when swallowing, difficulty with swallowing, hoarseness,   Neck:  denies swollen glands, tender glands  Cardiopulmonary: endorses shortness of breath on exertion, productive cough, denies pain in chest with deep breaths, pain in chest when lying down, murmurs, sudden changes in heart beat, wheezing, dry cough, shortness of breath at rest, coughing of blood  Gastrointestinal: denies nausea, heartburn, stomach pain relieved by food, chronic constipation, chronic diarrhea, blood in stools, black stools  Genitourinary:  denies vaginal dryness, pain or burning on urination, blood in urine, cloudy urine, vaginal ulcers, penile ulcers  Hematologic: denies anemia, bleeding tendency, blood clots, bleeding gums  Skin:  denies easy bruising, hair loss, rash, rash worsened after sun exposure, hives/urticaria, skin thickening, skin tightness, nodules/bumps, color changes of hands or feet in the cold (Raynaud's)  Neurologic:  denies numbness or tingling in hands, numbness or tingling in feet, muscle weakness  Psychiatric:  denies depression, excessive worries, PTSD, Bipolar  Sleep: endorses daytime somnolence, denies poor sleep (6 hours), denies snoring, apnea, difficulty falling asleep, difficulty staying asleep     PAST MEDICAL HISTORY    Past Medical History:   Diagnosis Date   ???  COPD (chronic obstructive pulmonary disease) ([place])         History reviewed. No pertinent surgical history.    FAMILY HISTORY    History reviewed. No pertinent family history.    SOCIAL HISTORY    Social History     Tobacco Use   ??? Smoking status: Current Every Day Smoker     Packs/day: 1.00     Types: Cigarettes   ??? Smokeless tobacco: Never Used   Substance Use Topics   ??? Alcohol use: No   ??? Drug use: Not on file       MEDICATIONS     Current Outpatient Medications   Medication Sig Dispense Refill   ??? omega 3-dha-epa-fish oil (FISH OIL) 100-160-1,000 mg cap Take  by mouth.     ??? HYDROcodone-acetaminophen (NORCO) 5-325 mg per tablet Take 1 Tab by mouth every four (4) hours as needed for Pain. Max Daily Amount: 6 Tabs. 10 Tab 0   ??? promethazine (PHENERGAN) 25 mg tablet Take 1 Tab by mouth every six (6) hours as needed. 12 Tab 0       ALLERGIES    No Known Allergies    PHYSICAL EXAMINATION    Visit Vitals  BP 118/76 (BP 1 Location: Right arm, BP Patient Position: Sitting)   Pulse 79   Temp 98.5 ??F (36.9 ??C) (Oral)   Resp 18   Ht 5' 11"  (1.803 m)   Wt 226 lb (102.5 kg)   SpO2 97%   BMI 31.52 kg/m??     Body mass index is 31.52 kg/m??.    General: NAD  HEENT: PERRL, anicteric, mild swelling noted on b/l eye lids.  Some erythema in left eye sclera  Lymphatic: No cervical or axillary lymphadenopathy.  Cardiovascular: S1, S2,no R/M/G  Pulmonary: CTA b/l. No wheezes/rales/rhonchi.  Abdominal: Soft,NTND, + BS.  Skin: No rash, nodules, or periungual changes.  Neuro: Alert; able to carry normal conversation    Musculoskeletal:   Cervical & Lumbar Spine  Neck and spine have no noted deformities or signs of inflammation.  Curvature of cervical, thoracic, and lumbar spine are within normal limits.       Wrist, Hand, & Fingers  No bony deformities, inflammation, or tenderness of bony prominences. No anatomical snuff box tenderness; Full ROM in DIP, PIP, MCP, & carpal joints & with supination and pronation.       Elbow  No bony deformities, inflammation, or tenderness in olecranon, medial, lateral epicondyle elbow. Full ROM upon flexion and extension.       Shoulder  No bony deformities, inflammation, or tenderness in rotator cuff, biceps tendon, or acromioclavicular joint. Full ROM and strength in shoulder upon adduction, abduction, internal and external rotation.      Hip  No bony deformities, inflammation, or tenderness in hip joint.       Knee   FROM of the knee. NO swelling or warmth noted. No bony deformity noted    Ankle/toes  No bony deformities, inflammation or tenderness    DATA REVIEW    Prior medical records were reviewed and if applicable are summarized as below:    Labs:   N/A    Imaging:   N/A    ASSESSMENT AND PLAN    A 62 y.o. female with hx of COPD presents for evaluation of irritation and swelling around her eyes. The patient was told she had SLE but I do not have any records to confirm this. Further her clinical picture is not very  consistent with SLE. She will need further evaluation to determine the etiology of her symptoms.     # Eye irritation:  - will obtain records from ophtho.   - depending on the results, will see if she needs more blood work and I will then contact he patient to order the blood work.     # COPD:  - advised smoking cessation    RTC in 3 weeks    The patient voiced understanding of the aforementioned assessment and plan. Summary of plan was provided in the After Visit Summary patient instructions. I also provided education about MyChart setup and utility.    Tara Hogan has a reminder for a "due or due soon" health maintenance. I have asked that she contact her primary care provider for follow-up on this health maintenance.    TODAY'S ORDERS    No orders of the defined types were placed in this encounter.      Future Appointments   Date Time Provider [place]   11/30/2018  8:20 AM Elodia Florence, MD [place]       Elodia Florence, MD    Adult Rheumatology   [facility]  [facility] of [facility]  [address] [place] [address]   Phone [phone]  Fax [phone]    ADDENDUM:   CT orbit (09/2018): normal  07/2018: ANCA, TSH, cbc, BMP, ACE. T. Pallidum, ESR, CRP, TPO, ANA normal, RF positive

## 2018-11-24 LAB — QUANTIFERON-TB GOLD PLUS
QUANTIFERON PLUS, QF2T: NEGATIVE
QUANTIFERON TB1 AG: 0.06 IU/mL
QUANTIFERON TB2 AG: 0.05 IU/mL
QuantiFERON Mitogen Value: >10 IU/mL
QuantiFERON Mitogen Value: >10 IU/mL
QuantiFERON Nil Value: 0.05 IU/mL
QuantiFERON Nil Value: 0.05 IU/mL
QuantiFERON Plus: NEGATIVE
QuantiFERON TB1 Ag: 0.06 IU/mL
QuantiFERON TB2 Ag: 0.05 IU/mL

## 2018-11-24 LAB — CHRONIC HEPATITIS PANEL
HCV Ab: 0.1 s/co ratio (ref 0.0–0.9)
HCV Ab: 0.1 s/co ratio (ref 0.0–0.9)
HEP B SURFACE AB, QUAL, 006408: NONREACTIVE
HEP B SURFACE AB, QUAL: NONREACTIVE
HEPATITIS BE AB, 006635: NEGATIVE
HEPATITIS BE AG, 006619: NEGATIVE
Hep B Core Ab, IgM: NEGATIVE
Hep B Core Ab, IgM: NEGATIVE
Hep B Core Ab, total: NEGATIVE
Hep B Core Total Ab: NEGATIVE
Hep B surface Ag screen: NEGATIVE
Hepatitis B Surface Ag: NEGATIVE
Hepatitis Be Antibody: NEGATIVE
Hepatitis Be Antigen: NEGATIVE

## 2018-11-24 LAB — SJOGREN'S ABS, SSA AND SSB
Sjogren's Anti-SS-A: <0.2 AI (ref 0.0–0.9)
Sjogren's Anti-SS-A: <0.2 AI (ref 0.0–0.9)
Sjogren's Anti-SS-B: <0.2 AI (ref 0.0–0.9)
Sjogren's Anti-SS-B: <0.2 AI (ref 0.0–0.9)

## 2018-11-24 LAB — COMMENT

## 2018-11-24 LAB — CYCLIC CITRUL PEPTIDE AB, IGG
CCP ANTIBODIES IGG/IGA: 6 units (ref 0–19)
CCP Antibodies IgG/IgA: 6 units (ref 0–19)

## 2018-11-30 ENCOUNTER — Encounter: Attending: Rheumatology | Primary: Student in an Organized Health Care Education/Training Program

## 2018-12-01 ENCOUNTER — Ambulatory Visit: Attending: Rheumatology

## 2018-12-01 ENCOUNTER — Ambulatory Visit
Admit: 2018-12-01 | Payer: PRIVATE HEALTH INSURANCE | Attending: Rheumatology | Primary: Student in an Organized Health Care Education/Training Program

## 2018-12-01 DIAGNOSIS — Z79899 Other long term (current) drug therapy: Secondary | ICD-10-CM

## 2018-12-01 MED ORDER — METHOTREXATE SODIUM 2.5 MG TAB
2.5 mg | ORAL_TABLET | ORAL | 3 refills | Status: DC
Start: 2018-12-01 — End: 2019-01-02

## 2018-12-01 MED ORDER — FOLIC ACID 1 MG TAB
1 mg | ORAL_TABLET | Freq: Every day | ORAL | 0 refills | Status: DC
Start: 2018-12-01 — End: 2019-01-02

## 2018-12-01 NOTE — Progress Notes (Signed)
Chief Complaint   Patient presents with   . Other     uveitis     1. Have you been to the ER, urgent care clinic since your last visit?  Hospitalized since your last visit?No    2. Have you seen or consulted any other health care providers outside of the [facility] since your last visit?  Include any pap smears or colon screening. No

## 2018-12-01 NOTE — Progress Notes (Signed)
REASON FOR VISIT    Patient presents for a follow up visit for    ICD-10-CM ICD-9-CM    1. Long-term use of immunosuppressant medication Z79.899 V58.69    2. 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        HISTORY OF DISEASE: Non specific orbital inflammation    Year of diagnosis: 2019  First visit with me: 10/2018  Cumulative disease manifestations: orbital inflammation  Positive serologies/labs: RF  Negative serologies/labs:   Other co-morbidities: + smoking  Relapses:      Past treatment history:  Prednisone: Oral tapers multiple times  Non-biologic DMARD:   Biologic:  Other:    HISTORY OF PRESENT ILLNESS     Previous medical records reviewed and summarized: yes  Review of ophtho records: eye edema responded to oral steroids and the ophthalmologist suspects non specific orbital inflammation    mHAQ:0  Pain scale: 1/10  The eyes are still swelling up.  Today they look good because she was given prednisone by over the holidays by the eye doctor. She was given oral steroids.  She will be done with prednisone today.       REVIEW OF SYSTEMS    Positives as per history  Negatives as follows:  CONSTITUTlONAL:    Denies unexplained persistent fevers, weight change, chronic fatigue  HEAD/EYES:              Denies eye redness, blurry vision or sudden loss of vision, dry eyes, HA, temporal artery pain  ENT:                            Denies oral/nasal ulcers, recurrent sinus infections, dry mouth  RESPIRATORY:         No pleuritic pain, history of pleural effusions, hemoptysis, exertional dyspnea  CARDIOVASCULAR:             Denies chest pain, history of pericardial effusions  GASTRO:                    Denies heartburn, esophageal dysmotility, abdominal pain, nausea, vomiting, diarrhea, blood in the stool  HEMATOLOGIC:        No easy bruising, purpura, swollen lymph nodes  SKIN:                           Denies alopecia, ulcers, nodules, sun sensitivity, unexplained persistent rash   VASCULAR:                Denies  edema, cyanosis, raynaud phenomenon  NEUROLOGIC:           Denies specific muscle weakness, paresthesias   PSYCHIATRIC:           No sleep disturbance / snoring, depression, anxiety  MSK:                           No morning stiffness >1 hour, SI joint pain, persistent joint swelling, persistent joint pain    PAST MEDICAL HISTORY    Past Medical History:   Diagnosis Date   ??? COPD (chronic obstructive pulmonary disease) ([place])         History reviewed. No pertinent surgical history.    FAMILY HISTORY    History reviewed. No pertinent family history.    SOCIAL HISTORY    Social History     Tobacco Use   ??? Smoking status: Current Every  Day Smoker     Packs/day: 1.00     Types: Cigarettes   ??? Smokeless tobacco: Never Used   Substance Use Topics   ??? Alcohol use: No   ??? Drug use: Not on file       MEDICATIONS    Current Outpatient Medications   Medication Sig Dispense Refill   ??? VENTOLIN HFA 90 mcg/actuation inhaler      ??? [START ON 12/03/2018] methotrexate (RHEUMATREX) 2.5 mg tablet Take 6 Tabs by mouth Every Saturday. 30 Tab 3   ??? folic acid (FOLVITE) 1 mg tablet Take 1 Tab by mouth daily. 90 Tab 0   ??? omega 3-dha-epa-fish oil (FISH OIL) 100-160-1,000 mg cap Take  by mouth.     ??? HYDROcodone-acetaminophen (NORCO) 5-325 mg per tablet Take 1 Tab by mouth every four (4) hours as needed for Pain. Max Daily Amount: 6 Tabs. 10 Tab 0   ??? promethazine (PHENERGAN) 25 mg tablet Take 1 Tab by mouth every six (6) hours as needed. 12 Tab 0       ALLERGIES    No Known Allergies    PHYSICAL EXAMINATION    Visit Vitals  BP 114/65 (BP 1 Location: Right arm, BP Patient Position: Sitting)   Pulse 73   Temp 98.1 ??F (36.7 ??C) (Oral)   Resp 20   Ht '5\' 11"'$  (1.803 m)   Wt 227 lb (103 kg)   SpO2 98%   BMI 31.66 kg/m??     Body mass index is 31.66 kg/m??.    General: NAD  HEENT: PERRL, anicteric, mild swelling noted on b/l eye lids.  B/l cheeks with faint flat erythema  Lymphatic: No cervical or axillary lymphadenopathy.  Cardiovascular: S1, S2,no  R/M/G  Pulmonary: CTA b/l. No wheezes/rales/rhonchi.  Abdominal: Soft,NTND, + BS.  Skin: No rash, nodules, or periungual changes.  Neuro: Alert; able to carry normal conversation    Musculoskeletal:   Cervical & Lumbar Spine  Neck and spine have no noted deformities or signs of inflammation.  Curvature of cervical, thoracic, and lumbar spine are within normal limits.       Wrist, Hand, & Fingers  No bony deformities, inflammation, or tenderness of bony prominences. No anatomical snuff box tenderness; Full ROM in DIP, PIP, MCP, & carpal joints & with supination and pronation.       Elbow  No bony deformities, inflammation, or tenderness in olecranon, medial, lateral epicondyle elbow. Full ROM upon flexion and extension.       Shoulder  No bony deformities, inflammation, or tenderness in rotator cuff, biceps tendon, or acromioclavicular joint. Full ROM and strength in shoulder upon adduction, abduction, internal and external rotation.      Hip  No bony deformities, inflammation, or tenderness in hip joint.       Knee  FROM of the knee. NO swelling or warmth noted. No bony deformity noted    Ankle/toes  No bony deformities, inflammation or tenderness    DATA REVIEW    Prior medical records were reviewed and if applicable are summarized as below:    Labs:   10/2018: Quant gold, HBV, HCV negative, SSA/B, CCP negative  07/2018: ANCA, TSH, cbc, BMP, ACE. T. Pallidum, ESR, CRP, TPO, ANA normal, RF positive    Imaging:   CT orbit (09/2018): normal    ASSESSMENT AND PLAN    A 63 y.o. 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 The patient's RF is positive but  she has no features of sjogrens syndrome or rheumatoid arthritis. 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 Given that she is steroid dependent, she will benefit from DMARD therapy as a steroid sparing agent.     # NSOI:  - will start patient on methotrexate 15 mg oral weekly. I explained the risks and benefits of methotrexate  and answered all the questions in detail.   - advised patient to practice abstinence from alcohol. Will monitor for this every visit  - daily folic acid    # COPD:  - advised smoking cessation    # Medication Toxicity Monitoring:  - cbc, cmp every 3 months  - Hepatitis B, C: negative 10/2018  - Quant gold: negative 10/2018  - Immunizations: Discuss at next visit  - bone health: Discuss at next visit    RTC in 4 weeks    The patient voiced understanding of the aforementioned assessment and plan. Summary of plan was provided in the After Visit Summary patient instructions. I also provided education about MyChart setup and utility.    Ms. Sumler has a reminder for a "due or due soon" health maintenance. I have asked that she contact her primary care provider for follow-up on this health maintenance.    TODAY'S ORDERS    Orders Placed This Encounter   ??? VENTOLIN HFA 90 mcg/actuation inhaler   ??? methotrexate (RHEUMATREX) 2.5 mg tablet   ??? folic acid (FOLVITE) 1 mg tablet       Future Appointments   Date Time Provider [place]   01/02/2019  9:40 AM Elodia Florence, MD [place], MD    Adult Rheumatology   [facility]  [facility] Part of [facility]  [address] [place] [address]   Phone [phone]  Fax [phone]

## 2018-12-01 NOTE — Progress Notes (Signed)
REASON FOR VISIT    Patient presents for a follow up visit for    ICD-10-CM ICD-9-CM    1. Long-term use of immunosuppressant medication Z79.899 V58.69    2. 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        HISTORY OF DISEASE: Non specific orbital inflammation    Year of diagnosis: 2019  First visit with me: 10/2018  Cumulative disease manifestations: orbital inflammation  Positive serologies/labs: RF  Negative serologies/labs:   Other co-morbidities: + smoking  Relapses:      Past treatment history:  Prednisone: Oral tapers multiple times  Non-biologic DMARD:   Biologic:  Other:    HISTORY OF PRESENT ILLNESS     Previous medical records reviewed and summarized: yes  Review of ophtho records: eye edema responded to oral steroids and the ophthalmologist suspects non specific orbital inflammation    mHAQ:0  Pain scale: 1/10  The eyes are still swelling up.  Today they look good because she was given prednisone by over the holidays by the eye doctor. She was given oral steroids.  She will be done with prednisone today.       REVIEW OF SYSTEMS    Positives as per history  Negatives as follows:  CONSTITUTlONAL:    Denies unexplained persistent fevers, weight change, chronic fatigue  HEAD/EYES:              Denies eye redness, blurry vision or sudden loss of vision, dry eyes, HA, temporal artery pain  ENT:                            Denies oral/nasal ulcers, recurrent sinus infections, dry mouth  RESPIRATORY:         No pleuritic pain, history of pleural effusions, hemoptysis, exertional dyspnea  CARDIOVASCULAR:             Denies chest pain, history of pericardial effusions  GASTRO:                    Denies heartburn, esophageal dysmotility, abdominal pain, nausea, vomiting, diarrhea, blood in the stool  HEMATOLOGIC:        No easy bruising, purpura, swollen lymph nodes  SKIN:                           Denies alopecia, ulcers, nodules, sun sensitivity, unexplained persistent rash    VASCULAR:                Denies edema, cyanosis, raynaud phenomenon  NEUROLOGIC:           Denies specific muscle weakness, paresthesias   PSYCHIATRIC:           No sleep disturbance / snoring, depression, anxiety  MSK:                           No morning stiffness >1 hour, SI joint pain, persistent joint swelling, persistent joint pain    PAST MEDICAL HISTORY    Past Medical History:   Diagnosis Date   ??? COPD (chronic obstructive pulmonary disease) ([place])         History reviewed. No pertinent surgical history.    FAMILY HISTORY    History reviewed. No pertinent family history.    SOCIAL HISTORY    Social History     Tobacco Use   ??? Smoking status: Current Every  Day Smoker     Packs/day: 1.00     Types: Cigarettes   ??? Smokeless tobacco: Never Used   Substance Use Topics   ??? Alcohol use: No   ??? Drug use: Not on file       MEDICATIONS    Current Outpatient Medications   Medication Sig Dispense Refill   ??? VENTOLIN HFA 90 mcg/actuation inhaler      ??? [START ON 12/03/2018] methotrexate (RHEUMATREX) 2.5 mg tablet Take 6 Tabs by mouth Every Saturday. 30 Tab 3   ??? folic acid (FOLVITE) 1 mg tablet Take 1 Tab by mouth daily. 90 Tab 0   ??? omega 3-dha-epa-fish oil (FISH OIL) 100-160-1,000 mg cap Take  by mouth.     ??? HYDROcodone-acetaminophen (NORCO) 5-325 mg per tablet Take 1 Tab by mouth every four (4) hours as needed for Pain. Max Daily Amount: 6 Tabs. 10 Tab 0   ??? promethazine (PHENERGAN) 25 mg tablet Take 1 Tab by mouth every six (6) hours as needed. 12 Tab 0       ALLERGIES    No Known Allergies    PHYSICAL EXAMINATION    Visit Vitals  BP 114/65 (BP 1 Location: Right arm, BP Patient Position: Sitting)   Pulse 73   Temp 98.1 ??F (36.7 ??C) (Oral)   Resp 20   Ht _0  (1.803 m)   Wt 227 lb (103 kg)   SpO2 98%   BMI 31.66 kg/m??     Body mass index is 31.66 kg/m??.    General: NAD  HEENT: PERRL, anicteric, mild swelling noted on b/l eye lids.  B/l cheeks with faint flat erythema   Lymphatic: No cervical or axillary lymphadenopathy.  Cardiovascular: S1, S2,no R/M/G  Pulmonary: CTA b/l. No wheezes/rales/rhonchi.  Abdominal: Soft,NTND, + BS.  Skin: No rash, nodules, or periungual changes.  Neuro: Alert; able to carry normal conversation    Musculoskeletal:   Cervical & Lumbar Spine  Neck and spine have no noted deformities or signs of inflammation.  Curvature of cervical, thoracic, and lumbar spine are within normal limits.       Wrist, Hand, & Fingers  No bony deformities, inflammation, or tenderness of bony prominences. No anatomical snuff box tenderness; Full ROM in DIP, PIP, MCP, & carpal joints & with supination and pronation.       Elbow  No bony deformities, inflammation, or tenderness in olecranon, medial, lateral epicondyle elbow. Full ROM upon flexion and extension.       Shoulder  No bony deformities, inflammation, or tenderness in rotator cuff, biceps tendon, or acromioclavicular joint. Full ROM and strength in shoulder upon adduction, abduction, internal and external rotation.      Hip  No bony deformities, inflammation, or tenderness in hip joint.       Knee  FROM of the knee. NO swelling or warmth noted. No bony deformity noted    Ankle/toes  No bony deformities, inflammation or tenderness    DATA REVIEW    Prior medical records were reviewed and if applicable are summarized as below:    Labs:   10/2018: Quant gold, HBV, HCV negative, SSA/B, CCP negative  07/2018: ANCA, TSH, cbc, BMP, ACE. T. Pallidum, ESR, CRP, TPO, ANA normal, RF positive    Imaging:   CT orbit (09/2018): normal    ASSESSMENT AND PLAN    A 63 y.o. 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 The patient's RF is positive but  she has no features of sjogrens syndrome or rheumatoid arthritis. 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 Given that she is steroid dependent, she will benefit from DMARD therapy as a steroid sparing agent.     # NSOI:   - will start patient on methotrexate 15 mg oral weekly. I explained the risks and benefits of methotrexate and answered all the questions in detail.   - advised patient to practice abstinence from alcohol. Will monitor for this every visit  - daily folic acid    # COPD:  - advised smoking cessation    # Medication Toxicity Monitoring:  - cbc, cmp every 3 months  - Hepatitis B, C: negative 10/2018  - Quant gold: negative 10/2018  - Immunizations: Discuss at next visit  - bone health: Discuss at next visit    RTC in 4 weeks    The patient voiced understanding of the aforementioned assessment and plan. Summary of plan was provided in the After Visit Summary patient instructions. I also provided education about MyChart setup and utility.    Ms. England has a reminder for a "due or due soon" health maintenance. I have asked that she contact her primary care provider for follow-up on this health maintenance.    TODAY'S ORDERS    Orders Placed This Encounter   ??? VENTOLIN HFA 90 mcg/actuation inhaler   ??? methotrexate (RHEUMATREX) 2.5 mg tablet   ??? folic acid (FOLVITE) 1 mg tablet       Future Appointments   Date Time Provider [place]   01/02/2019  9:40 AM Elodia Florence, MD [place], MD    Adult Rheumatology   [facility]  [facility]  [address], [place] [address]   Phone [phone]  Fax [phone]

## 2018-12-01 NOTE — Progress Notes (Signed)
Chief Complaint   Patient presents with   ??? Other     uveitis     1. Have you been to the ER, urgent care clinic since your last visit?  Hospitalized since your last visit?No    2. Have you seen or consulted any other health care providers outside of the [facility] since your last visit?  Include any pap smears or colon screening. No

## 2018-12-01 NOTE — Patient Instructions (Signed)
Please fill out your [place] survey you will receive after your visit in the mail or via MyChart!

## 2018-12-13 NOTE — Telephone Encounter (Signed)
-----   Message from Alexia A Adderley sent at 12/13/2018  8:18 AM EST -----  Regarding: Dr. Melanie Crazier  General Message/Vendor Calls    Caller's first and last name: Tara Hogan      Reason for call: requesting a callback in regards to her medication. She didn't want to tell me the what was going on she just said it was about the medication.      Callback required yes/no and why: yes      Best contact number(s): [phone]      Details to clarify the request:      Alexia A Adderley

## 2018-12-13 NOTE — Telephone Encounter (Signed)
Pt states she has been taking methotrexate for two weeks and here eyes are still swollen, her shoulders hurts bilaterally as well as her lungs and kidney area.   Dr. Allena Katz and [facility] notified.   Patient advised that she may be coming down with something and the next dose is not due until next week, per Dr. Horton Finer and Dr. Allena Katz. Advised that she should contact her PCP and we will inform Dr. Alena Bills regarding this when she returns on tomorrow, since her next dose is not until next week.  Pt states she is going to [facility].

## 2019-01-02 ENCOUNTER — Ambulatory Visit: Attending: Rheumatology

## 2019-01-02 ENCOUNTER — Ambulatory Visit
Admit: 2019-01-02 | Payer: PRIVATE HEALTH INSURANCE | Attending: Rheumatology | Primary: Student in an Organized Health Care Education/Training Program

## 2019-01-02 DIAGNOSIS — Z79899 Other long term (current) drug therapy: Secondary | ICD-10-CM

## 2019-01-02 MED ORDER — AZATHIOPRINE 50 MG TAB
50 mg | ORAL_TABLET | Freq: Every day | ORAL | 3 refills | Status: AC
Start: 2019-01-02 — End: ?

## 2019-01-02 MED ORDER — CALCIUM CARBONATE 600 MG-VITAMIN D3 20 MCG (800 UNIT) CHEWABLE TABLET
600 mg-20 mcg (800 unit) | ORAL_TABLET | Freq: Every day | ORAL | 1 refills | Status: AC
Start: 2019-01-02 — End: ?

## 2019-01-02 MED ORDER — PREDNISONE 20 MG TAB
20 mg | ORAL_TABLET | Freq: Every day | ORAL | 0 refills | Status: AC
Start: 2019-01-02 — End: ?

## 2019-01-02 NOTE — Progress Notes (Signed)
REASON FOR VISIT    Patient presents for a follow up visit for    ICD-10-CM ICD-9-CM    1. Long-term use of immunosuppressant medication Z79.899 V58.69 CBC WITH AUTOMATED DIFF      METABOLIC PANEL, COMPREHENSIVE   2. 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        HISTORY OF DISEASE: Non specific orbital inflammation    Year of diagnosis: 2019  First visit with me: 10/2018  Cumulative disease manifestations: orbital inflammation  Positive serologies/labs: RF  Negative serologies/labs:   Other co-morbidities: + smoking  Relapses:      Past treatment history:  Prednisone: Oral tapers multiple times  Non-biologic DMARD:  Methotrexate 15 mg oral weekly (11/2018-present)  Biologic:  Other: folic acid daily    HISTORY OF PRESENT ILLNESS     Previous medical records reviewed and summarized: yes  Review of ophtho records: eye edema responded to oral steroids and the ophthalmologist suspects non specific orbital inflammation    mHAQ:0  Pain scale: 1/10  She took methotrexate for 3 weeks but after the second week she started getting shoulder pain.  It didn't go away when she stopped the methotrexate.  Patient is still drinking heavily.    REVIEW OF SYSTEMS    Positives as per history  Negatives as follows:  CONSTITUTlONAL:    Denies unexplained persistent fevers, weight change, chronic fatigue  HEAD/EYES:              Denies eye redness, blurry vision or sudden loss of vision, dry eyes, HA, temporal artery pain  ENT:                            Denies oral/nasal ulcers, recurrent sinus infections, dry mouth  RESPIRATORY:         No pleuritic pain, history of pleural effusions, hemoptysis, exertional dyspnea  CARDIOVASCULAR:             Denies chest pain, history of pericardial effusions  GASTRO:                    Denies heartburn, esophageal dysmotility, abdominal pain, nausea, vomiting, diarrhea, blood in the stool  HEMATOLOGIC:        No easy bruising, purpura, swollen lymph nodes  SKIN:                           Denies  alopecia, ulcers, nodules, sun sensitivity, unexplained persistent rash   VASCULAR:                Denies edema, cyanosis, raynaud phenomenon  NEUROLOGIC:           Denies specific muscle weakness, paresthesias   PSYCHIATRIC:           No sleep disturbance / snoring, depression, anxiety  MSK:                           No morning stiffness >1 hour, SI joint pain, persistent joint swelling, persistent joint pain    PAST MEDICAL HISTORY    Past Medical History:   Diagnosis Date   ??? COPD (chronic obstructive pulmonary disease) ([place])         History reviewed. No pertinent surgical history.    FAMILY HISTORY    History reviewed. No pertinent family history.    SOCIAL HISTORY    Social History  Tobacco Use   ??? Smoking status: Current Every Day Smoker     Packs/day: 1.00     Types: Cigarettes   ??? Smokeless tobacco: Never Used   Substance Use Topics   ??? Alcohol use: No   ??? Drug use: Not on file       MEDICATIONS    Current Outpatient Medications   Medication Sig Dispense Refill   ??? azaTHIOprine (IMURAN) 50 mg tablet Take 2 Tabs by mouth daily. START 1 tab daily for 14 days then 2 tabs daily 60 Tab 3   ??? predniSONE (DELTASONE) 20 mg tablet Take 20 mg by mouth daily. 30 Tab 0   ??? calcium carbonate-vitamin D3 (CALTRATE 600 PLUS D) 600 mg (1,500 mg)-800 unit chew Take 2 Tabs by mouth daily. 180 Tab 1   ??? VENTOLIN HFA 90 mcg/actuation inhaler      ??? omega 3-dha-epa-fish oil (FISH OIL) 100-160-1,000 mg cap Take  by mouth.     ??? HYDROcodone-acetaminophen (NORCO) 5-325 mg per tablet Take 1 Tab by mouth every four (4) hours as needed for Pain. Max Daily Amount: 6 Tabs. 10 Tab 0   ??? promethazine (PHENERGAN) 25 mg tablet Take 1 Tab by mouth every six (6) hours as needed. 12 Tab 0       ALLERGIES    No Known Allergies    PHYSICAL EXAMINATION    Visit Vitals  BP 124/80 (BP 1 Location: Right arm, BP Patient Position: Sitting)   Pulse 87   Temp 98.3 ??F (36.8 ??C) (Oral)   Resp 20   Ht '5\' 11"'$  (1.803 m)   Wt 224 lb 11.2 oz (101.9 kg)    SpO2 96%   BMI 31.34 kg/m??     Body mass index is 31.34 kg/m??.    General: NAD  HEENT: PERRL, anicteric, mild swelling noted on b/l eye lids.  B/l cheeks with faint flat erythema  Lymphatic: No cervical or axillary lymphadenopathy.  Cardiovascular: S1, S2,no R/M/G  Pulmonary: CTA b/l. No wheezes/rales/rhonchi.  Abdominal: Soft,NTND, + BS.  Skin: No rash, nodules, or periungual changes.  Neuro: Alert; able to carry normal conversation    Musculoskeletal:   Cervical & Lumbar Spine  Neck and spine have no noted deformities or signs of inflammation.  Curvature of cervical, thoracic, and lumbar spine are within normal limits.       Wrist, Hand, & Fingers  No bony deformities, inflammation, or tenderness of bony prominences. No anatomical snuff box tenderness; Full ROM in DIP, PIP, MCP, & carpal joints & with supination and pronation.       Elbow  No bony deformities, inflammation, or tenderness in olecranon, medial, lateral epicondyle elbow. Full ROM upon flexion and extension.       Shoulder  No bony deformities, inflammation, or tenderness in rotator cuff, biceps tendon, or acromioclavicular joint. Full ROM and strength in shoulder upon adduction, abduction, internal and external rotation.      Hip  No bony deformities, inflammation, or tenderness in hip joint.       Knee  FROM of the knee. NO swelling or warmth noted. No bony deformity noted    Ankle/toes  No bony deformities, inflammation or tenderness    DATA REVIEW    Prior medical records were reviewed and if applicable are summarized as below:    Labs:   10/2018: Quant gold, HBV, HCV negative, SSA/B, CCP negative  07/2018: ANCA, TSH, cbc, BMP, ACE. T. Pallidum, ESR, CRP, TPO, ANA normal, RF positive  Imaging:   CT orbit (09/2018): normal    ASSESSMENT AND PLAN    A 63 y.o. 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 The patient's RF is positive but she has no features of sjogrens syndrome or rheumatoid arthritis. 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 She was started on methotrexate but she is not able to be abstinent from alcohol therefore I will switch her therapy.      # NSOI:  - will start patient on azathioprine 200 mg oral daily. Will start with 100 mg daily for 2 weeks then increase to 200 mg oral daily. I explained the risks and benefits of azathioprine and answered all the questions in detail.   - will start her on prednisone 20 mg oral daily given she has some swelling around her eyes today.   - I counseled the patient on the risk of avascular necrosis from corticosteroids. For each 20 mg of prednisone taken for over a month, the risk of AVN is 5%.  It can also lead to weight gain, mood imbalances, osteoporosis, acne etc.   - can consider Humira in the future    # COPD:  - advised smoking cessation    # Medication Toxicity Monitoring:  - cbc, cmp next visit  - Hepatitis B, C: negative 10/2018  - Quant gold: negative 10/2018  - Immunizations: Discuss at next visit  - bone health: caltrate prescribed    RTC in 1 month     The patient voiced understanding of the aforementioned assessment and plan. Summary of plan was provided in the After Visit Summary patient instructions. I also provided education about MyChart setup and utility.    Ms. Tara Hogan has a reminder for a "due or due soon" health maintenance. I have asked that she contact her primary care provider for follow-up on this health maintenance.    TODAY'S ORDERS    Orders Placed This Encounter   ??? CBC WITH AUTOMATED DIFF   ??? METABOLIC PANEL, COMPREHENSIVE   ??? azaTHIOprine (IMURAN) 50 mg tablet   ??? predniSONE (DELTASONE) 20 mg tablet   ??? calcium carbonate-vitamin D3 (CALTRATE 600 PLUS D) 600 mg (1,500 mg)-800 unit chew       No future appointments.    Elodia Florence, MD    Adult Rheumatology   [facility]  A Part of [facility]  [address] [place] [address]   Phone [phone]  Fax [phone]

## 2019-01-02 NOTE — Progress Notes (Signed)
Chief Complaint   Patient presents with   . Other     orbital inflammation     1. Have you been to the ER, urgent care clinic since your last visit?  Hospitalized since your last visit?No    2. Have you seen or consulted any other health care providers outside of the [facility] since your last visit?  Include any pap smears or colon screening. Yes Where: PCP

## 2019-01-02 NOTE — Progress Notes (Signed)
REASON FOR VISIT    Patient presents for a follow up visit for    ICD-10-CM ICD-9-CM    1. Long-term use of immunosuppressant medication Z79.899 V58.69 CBC WITH AUTOMATED DIFF      METABOLIC PANEL, COMPREHENSIVE   2. 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        HISTORY OF DISEASE: Non specific orbital inflammation    Year of diagnosis: 2019  First visit with me: 10/2018  Cumulative disease manifestations: orbital inflammation  Positive serologies/labs: RF  Negative serologies/labs:   Other co-morbidities: + smoking  Relapses:      Past treatment history:  Prednisone: Oral tapers multiple times  Non-biologic DMARD:  Methotrexate 15 mg oral weekly (11/2018-present)  Biologic:  Other: folic acid daily    HISTORY OF PRESENT ILLNESS     Previous medical records reviewed and summarized: yes  Review of ophtho records: eye edema responded to oral steroids and the ophthalmologist suspects non specific orbital inflammation    mHAQ:0  Pain scale: 1/10  She took methotrexate for 3 weeks but after the second week she started getting shoulder pain.  It didn't go away when she stopped the methotrexate.  Patient is still drinking heavily.    REVIEW OF SYSTEMS    Positives as per history  Negatives as follows:  CONSTITUTlONAL:    Denies unexplained persistent fevers, weight change, chronic fatigue  HEAD/EYES:              Denies eye redness, blurry vision or sudden loss of vision, dry eyes, HA, temporal artery pain  ENT:                            Denies oral/nasal ulcers, recurrent sinus infections, dry mouth  RESPIRATORY:         No pleuritic pain, history of pleural effusions, hemoptysis, exertional dyspnea  CARDIOVASCULAR:             Denies chest pain, history of pericardial effusions  GASTRO:                    Denies heartburn, esophageal dysmotility, abdominal pain, nausea, vomiting, diarrhea, blood in the stool  HEMATOLOGIC:        No easy bruising, purpura, swollen lymph nodes   SKIN:                           Denies alopecia, ulcers, nodules, sun sensitivity, unexplained persistent rash   VASCULAR:                Denies edema, cyanosis, raynaud phenomenon  NEUROLOGIC:           Denies specific muscle weakness, paresthesias   PSYCHIATRIC:           No sleep disturbance / snoring, depression, anxiety  MSK:                           No morning stiffness >1 hour, SI joint pain, persistent joint swelling, persistent joint pain    PAST MEDICAL HISTORY    Past Medical History:   Diagnosis Date   ??? COPD (chronic obstructive pulmonary disease) ([place])         History reviewed. No pertinent surgical history.    FAMILY HISTORY    History reviewed. No pertinent family history.    SOCIAL HISTORY    Social History  Tobacco Use   ??? Smoking status: Current Every Day Smoker     Packs/day: 1.00     Types: Cigarettes   ??? Smokeless tobacco: Never Used   Substance Use Topics   ??? Alcohol use: No   ??? Drug use: Not on file       MEDICATIONS    Current Outpatient Medications   Medication Sig Dispense Refill   ??? azaTHIOprine (IMURAN) 50 mg tablet Take 2 Tabs by mouth daily. START 1 tab daily for 14 days then 2 tabs daily 60 Tab 3   ??? predniSONE (DELTASONE) 20 mg tablet Take 20 mg by mouth daily. 30 Tab 0   ??? calcium carbonate-vitamin D3 (CALTRATE 600 PLUS D) 600 mg (1,500 mg)-800 unit chew Take 2 Tabs by mouth daily. 180 Tab 1   ??? VENTOLIN HFA 90 mcg/actuation inhaler      ??? omega 3-dha-epa-fish oil (FISH OIL) 100-160-1,000 mg cap Take  by mouth.     ??? HYDROcodone-acetaminophen (NORCO) 5-325 mg per tablet Take 1 Tab by mouth every four (4) hours as needed for Pain. Max Daily Amount: 6 Tabs. 10 Tab 0   ??? promethazine (PHENERGAN) 25 mg tablet Take 1 Tab by mouth every six (6) hours as needed. 12 Tab 0       ALLERGIES    No Known Allergies    PHYSICAL EXAMINATION    Visit Vitals  BP 124/80 (BP 1 Location: Right arm, BP Patient Position: Sitting)   Pulse 87   Temp 98.3 ??F (36.8 ??C) (Oral)   Resp 20    Ht 5' 11"  (1.803 m)   Wt 224 lb 11.2 oz (101.9 kg)   SpO2 96%   BMI 31.34 kg/m??     Body mass index is 31.34 kg/m??.    General: NAD  HEENT: PERRL, anicteric, mild swelling noted on b/l eye lids.  B/l cheeks with faint flat erythema  Lymphatic: No cervical or axillary lymphadenopathy.  Cardiovascular: S1, S2,no R/M/G  Pulmonary: CTA b/l. No wheezes/rales/rhonchi.  Abdominal: Soft,NTND, + BS.  Skin: No rash, nodules, or periungual changes.  Neuro: Alert; able to carry normal conversation    Musculoskeletal:   Cervical & Lumbar Spine  Neck and spine have no noted deformities or signs of inflammation.  Curvature of cervical, thoracic, and lumbar spine are within normal limits.       Wrist, Hand, & Fingers  No bony deformities, inflammation, or tenderness of bony prominences. No anatomical snuff box tenderness; Full ROM in DIP, PIP, MCP, & carpal joints & with supination and pronation.       Elbow  No bony deformities, inflammation, or tenderness in olecranon, medial, lateral epicondyle elbow. Full ROM upon flexion and extension.       Shoulder  No bony deformities, inflammation, or tenderness in rotator cuff, biceps tendon, or acromioclavicular joint. Full ROM and strength in shoulder upon adduction, abduction, internal and external rotation.      Hip  No bony deformities, inflammation, or tenderness in hip joint.       Knee  FROM of the knee. NO swelling or warmth noted. No bony deformity noted    Ankle/toes  No bony deformities, inflammation or tenderness    DATA REVIEW    Prior medical records were reviewed and if applicable are summarized as below:    Labs:   10/2018: Quant gold, HBV, HCV negative, SSA/B, CCP negative  07/2018: ANCA, TSH, cbc, BMP, ACE. T. Pallidum, ESR, CRP, TPO, ANA normal, RF positive  Imaging:   CT orbit (09/2018): normal    ASSESSMENT AND PLAN    A 63 y.o. 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 The patient's RF is positive but she has no  features of sjogrens syndrome or rheumatoid arthritis. 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 She was started on methotrexate but she is not able to be abstinent from alcohol therefore I will switch her therapy.      # NSOI:  - will start patient on azathioprine 200 mg oral daily. Will start with 100 mg daily for 2 weeks then increase to 200 mg oral daily. I explained the risks and benefits of azathioprine and answered all the questions in detail.   - will start her on prednisone 20 mg oral daily given she has some swelling around her eyes today.   - I counseled the patient on the risk of avascular necrosis from corticosteroids. For each 20 mg of prednisone taken for over a month, the risk of AVN is 5%.  It can also lead to weight gain, mood imbalances, osteoporosis, acne etc.   - can consider Humira in the future    # COPD:  - advised smoking cessation    # Medication Toxicity Monitoring:  - cbc, cmp next visit  - Hepatitis B, C: negative 10/2018  - Quant gold: negative 10/2018  - Immunizations: Discuss at next visit  - bone health: caltrate prescribed    RTC in 1 month     The patient voiced understanding of the aforementioned assessment and plan. Summary of plan was provided in the After Visit Summary patient instructions. I also provided education about MyChart setup and utility.    Ms. Carvey has a reminder for a "due or due soon" health maintenance. I have asked that she contact her primary care provider for follow-up on this health maintenance.    TODAY'S ORDERS    Orders Placed This Encounter   ??? CBC WITH AUTOMATED DIFF   ??? METABOLIC PANEL, COMPREHENSIVE   ??? azaTHIOprine (IMURAN) 50 mg tablet   ??? predniSONE (DELTASONE) 20 mg tablet   ??? calcium carbonate-vitamin D3 (CALTRATE 600 PLUS D) 600 mg (1,500 mg)-800 unit chew       No future appointments.    Elodia Florence, MD    Adult Rheumatology   [facility]  A Part of [facility]  [address] [place] [address]    Phone [phone]  Fax [phone]

## 2019-01-02 NOTE — Patient Instructions (Signed)
Please fill out your [place] survey you will receive after your visit in the mail or via MyChart!

## 2019-01-02 NOTE — Progress Notes (Signed)
Chief Complaint   Patient presents with   ??? Other     orbital inflammation     1. Have you been to the ER, urgent care clinic since your last visit?  Hospitalized since your last visit?No    2. Have you seen or consulted any other health care providers outside of the [facility] since your last visit?  Include any pap smears or colon screening. Yes Where: PCP

## 2019-02-10 ENCOUNTER — Encounter: Attending: Rheumatology | Primary: Student in an Organized Health Care Education/Training Program

## 2019-02-10 NOTE — Progress Notes (Deleted)
REASON FOR VISIT    Patient presents for a follow up visit for  No diagnosis found.    HISTORY OF DISEASE: Non specific orbital inflammation    Year of diagnosis: 2019  First visit with me: 10/2018  Cumulative disease manifestations: orbital inflammation  Positive serologies/labs: RF  Negative serologies/labs:   Other co-morbidities: + smoking  Relapses:      Past treatment history:  Prednisone: Oral tapers multiple times  Non-biologic DMARD:  Methotrexate 15 mg oral weekly (11/2018-12/2018;d/ced due to alcohol use), azathioprine 200 mg oral daily (12/2018-present)  Biologic:  Other: folic acid daily    HISTORY OF PRESENT ILLNESS     Previous medical records reviewed and summarized: yes  Review of ophtho records: eye edema responded to oral steroids and the ophthalmologist suspects non specific orbital inflammation    mHAQ:0  Pain scale: 1/10  She took methotrexate for 3 weeks but after the second week she started getting shoulder pain.  It didn't go away when she stopped the methotrexate.  Patient is still drinking heavily.    REVIEW OF SYSTEMS    Positives as per history  Negatives as follows:  CONSTITUTlONAL:    Denies unexplained persistent fevers, weight change, chronic fatigue  HEAD/EYES:              Denies eye redness, blurry vision or sudden loss of vision, dry eyes, HA, temporal artery pain  ENT:                            Denies oral/nasal ulcers, recurrent sinus infections, dry mouth  RESPIRATORY:         No pleuritic pain, history of pleural effusions, hemoptysis, exertional dyspnea  CARDIOVASCULAR:             Denies chest pain, history of pericardial effusions  GASTRO:                    Denies heartburn, esophageal dysmotility, abdominal pain, nausea, vomiting, diarrhea, blood in the stool  HEMATOLOGIC:        No easy bruising, purpura, swollen lymph nodes  SKIN:                           Denies alopecia, ulcers, nodules, sun sensitivity, unexplained persistent rash    VASCULAR:                Denies edema, cyanosis, raynaud phenomenon  NEUROLOGIC:           Denies specific muscle weakness, paresthesias   PSYCHIATRIC:           No sleep disturbance / snoring, depression, anxiety  MSK:                           No morning stiffness >1 hour, SI joint pain, persistent joint swelling, persistent joint pain    PAST MEDICAL HISTORY    Past Medical History:   Diagnosis Date   ??? COPD (chronic obstructive pulmonary disease) ([facility])         No past surgical history on file.    FAMILY HISTORY    No family history on file.    SOCIAL HISTORY    Social History     Tobacco Use   ??? Smoking status: Current Every Day Smoker     Packs/day: 1.00     Types: Cigarettes   ???  Smokeless tobacco: Never Used   Substance Use Topics   ??? Alcohol use: No   ??? Drug use: Not on file       MEDICATIONS    Current Outpatient Medications   Medication Sig Dispense Refill   ??? azaTHIOprine (IMURAN) 50 mg tablet Take 2 Tabs by mouth daily. START 1 tab daily for 14 days then 2 tabs daily 60 Tab 3   ??? predniSONE (DELTASONE) 20 mg tablet Take 20 mg by mouth daily. 30 Tab 0   ??? calcium carbonate-vitamin D3 (CALTRATE 600 PLUS D) 600 mg (1,500 mg)-800 unit chew Take 2 Tabs by mouth daily. 180 Tab 1   ??? VENTOLIN HFA 90 mcg/actuation inhaler      ??? omega 3-dha-epa-fish oil (FISH OIL) 100-160-1,000 mg cap Take  by mouth.     ??? HYDROcodone-acetaminophen (NORCO) 5-325 mg per tablet Take 1 Tab by mouth every four (4) hours as needed for Pain. Max Daily Amount: 6 Tabs. 10 Tab 0   ??? promethazine (PHENERGAN) 25 mg tablet Take 1 Tab by mouth every six (6) hours as needed. 12 Tab 0       ALLERGIES    No Known Allergies    PHYSICAL EXAMINATION    There were no vitals taken for this visit.  There is no height or weight on file to calculate BMI.    General: NAD  HEENT: PERRL, anicteric, mild swelling noted on b/l eye lids.  B/l cheeks with faint flat erythema  Lymphatic: No cervical or axillary lymphadenopathy.  Cardiovascular: S1, S2,no R/M/G   Pulmonary: CTA b/l. No wheezes/rales/rhonchi.  Abdominal: Soft,NTND, + BS.  Skin: No rash, nodules, or periungual changes.  Neuro: Alert; able to carry normal conversation    Musculoskeletal:   Cervical & Lumbar Spine  Neck and spine have no noted deformities or signs of inflammation.  Curvature of cervical, thoracic, and lumbar spine are within normal limits.       Wrist, Hand, & Fingers  No bony deformities, inflammation, or tenderness of bony prominences. No anatomical snuff box tenderness; Full ROM in DIP, PIP, MCP, & carpal joints & with supination and pronation.       Elbow  No bony deformities, inflammation, or tenderness in olecranon, medial, lateral epicondyle elbow. Full ROM upon flexion and extension.       Shoulder  No bony deformities, inflammation, or tenderness in rotator cuff, biceps tendon, or acromioclavicular joint. Full ROM and strength in shoulder upon adduction, abduction, internal and external rotation.      Hip  No bony deformities, inflammation, or tenderness in hip joint.       Knee  FROM of the knee. NO swelling or warmth noted. No bony deformity noted    Ankle/toes  No bony deformities, inflammation or tenderness    DATA REVIEW    Prior medical records were reviewed and if applicable are summarized as below:    Labs:   10/2018: Quant gold, HBV, HCV negative, SSA/B, CCP negative  07/2018: ANCA, TSH, cbc, BMP, ACE. T. Pallidum, ESR, CRP, TPO, ANA normal, RF positive    Imaging:   CT orbit (09/2018): normal    ASSESSMENT AND PLAN    A 63 y.o. 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 The patient's RF is positive but she has no features of sjogrens syndrome or rheumatoid arthritis. 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 She was started on methotrexate but she is not able to be abstinent  from alcohol therefore I will switch her therapy.      # NSOI:  - will start patient on azathioprine 200 mg oral daily. Will start with  100 mg daily for 2 weeks then increase to 200 mg oral daily. I explained the risks and benefits of azathioprine and answered all the questions in detail.   - will start her on prednisone 20 mg oral daily given she has some swelling around her eyes today.   - I counseled the patient on the risk of avascular necrosis from corticosteroids. For each 20 mg of prednisone taken for over a month, the risk of AVN is 5%.  It can also lead to weight gain, mood imbalances, osteoporosis, acne etc.   - can consider Humira in the future    # COPD:  - advised smoking cessation    # Medication Toxicity Monitoring:  - cbc, cmp next visit  - Hepatitis B, C: negative 10/2018  - Quant gold: negative 10/2018  - Immunizations: Discuss at next visit  - bone health: caltrate prescribed    RTC in ***     The patient voiced understanding of the aforementioned assessment and plan. Summary of plan was provided in the After Visit Summary patient instructions. I also provided education about MyChart setup and utility.    Tara Hogan has a reminder for a "due or due soon" health maintenance. I have asked that she contact her primary care provider for follow-up on this health maintenance.    TODAY'S ORDERS    No orders of the defined types were placed in this encounter.      Future Appointments   Date Time Provider [place]   02/10/2019  9:40 AM Tara Florence, MD [place], MD    Adult Rheumatology   [facility]  A Part of [facility]  [address], [place] [address]   Phone [phone]  Fax [phone]

## 2019-02-10 NOTE — Telephone Encounter (Signed)
I called the patient to inquire about how she is doing. She noted she developed soreness in her mouth with azathioprine.  Her PCP yesterday diagnosed her with sinusitis and she is now on antibiotics and has stopped the azathioprine. I advised her to re-start the azathioprine at a reduced dose of 100 mg oral daily once her antibiotics are finished.  She is now off of the prednisone.   She will not come in today due to COVID-19 outbreak.

## 2019-03-13 ENCOUNTER — Encounter: Attending: Rheumatology | Primary: Student in an Organized Health Care Education/Training Program

## 2019-05-04 NOTE — Telephone Encounter (Signed)
Appointment scheduled for 05/08/19

## 2019-05-04 NOTE — Telephone Encounter (Signed)
I got a call from the patient's ophthalmologist today. 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  She had some imaging and there was concern for possible lymphoma but the ophthalmologist is not concerned about that at this time.  He feels that she has ocular inflammation since she does respond very well to prednisone.  Per the doctor's urging, the patient has agreed to take azathioprine again. At this time, we will give her 3 months on azathioprine. If she has no improvement on it, ophtho will consider biopsy before considering biologic therapy.   I will have my office call the patient to make a f/u appointment.

## 2019-05-09 ENCOUNTER — Ambulatory Visit: Attending: Rheumatology

## 2019-05-09 ENCOUNTER — Ambulatory Visit
Admit: 2019-05-09 | Payer: PRIVATE HEALTH INSURANCE | Attending: Rheumatology | Primary: Student in an Organized Health Care Education/Training Program

## 2019-05-09 DIAGNOSIS — Z79899 Other long term (current) drug therapy: Secondary | ICD-10-CM

## 2019-05-09 MED ORDER — AZATHIOPRINE 100 MG TABLET
100 mg | ORAL_TABLET | ORAL | 2 refills | Status: DC
Start: 2019-05-09 — End: 2019-05-19

## 2019-05-09 NOTE — Progress Notes (Signed)
 Chief Complaint   Patient presents with   . Other     optic inflammation     1. Have you been to the ER, urgent care clinic since your last visit?  Hospitalized since your last visit?Yes Where: [place] [facility]    2. Have you seen or consulted any other health care providers outside of the [facility] since your last visit?  Include any pap smears or colon screening. No

## 2019-05-09 NOTE — Progress Notes (Signed)
REASON FOR VISIT    Patient presents for a follow up visit for    ICD-10-CM ICD-9-CM    1. Long-term use of immunosuppressant medication Z79.899 V58.69    2. 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 Uveitis H20.9 364.3        HISTORY OF DISEASE: Non specific orbital inflammation    Year of diagnosis: 2019  First visit with me: 10/2018  Cumulative disease manifestations: orbital inflammation  Positive serologies/labs: RF  Negative serologies/labs:   Other co-morbidities: + smoking  Relapses:      Past treatment history:  Prednisone: Oral tapers multiple times  Non-biologic DMARD:  Methotrexate 15 mg oral weekly (11/2018-12/2018; dc/ed due to excessive alcohol use), Azathioprine 200 mg oral daily (12/2018-present;very noncompliant)  Biologic:  Other: folic acid daily    HISTORY OF PRESENT ILLNESS     Previous medical records reviewed and summarized: yes  Review of ophtho records: eye edema responded to oral steroids and the ophthalmologist suspects non specific orbital inflammation    The patient was seen by ophthalmologist and I spoke with him (see phone note).  She did not restart the azathioprine.  She doesn't feel good on it. She is not able to expand on that.  She notes swelling at the side of her neck as well for 2.5 months.      REVIEW OF SYSTEMS    Positives as per history  Negatives as follows:  CONSTITUTlONAL:    Denies unexplained persistent fevers, weight change, chronic fatigue  HEAD/EYES:              Denies eye redness, blurry vision or sudden loss of vision, dry eyes, HA, temporal artery pain  ENT:                            Denies oral/nasal ulcers, recurrent sinus infections, dry mouth  RESPIRATORY:         No pleuritic pain, history of pleural effusions, hemoptysis, exertional dyspnea  CARDIOVASCULAR:             Denies chest pain, history of pericardial effusions  GASTRO:                    Denies heartburn, esophageal dysmotility, abdominal pain, nausea, vomiting, diarrhea, blood in the  stool  HEMATOLOGIC:        No easy bruising, purpura, swollen lymph nodes  SKIN:                           Denies alopecia, ulcers, nodules, sun sensitivity, unexplained persistent rash   VASCULAR:                Denies edema, cyanosis, raynaud phenomenon  NEUROLOGIC:           Denies specific muscle weakness, paresthesias   PSYCHIATRIC:           No sleep disturbance / snoring, depression, anxiety  MSK:                           No morning stiffness >1 hour, SI joint pain, persistent joint swelling, persistent joint pain    PAST MEDICAL HISTORY    Past Medical History:   Diagnosis Date   ??? COPD (chronic obstructive pulmonary disease) ([place])         History reviewed. No pertinent surgical history.  FAMILY HISTORY    History reviewed. No pertinent family history.    SOCIAL HISTORY    Social History     Tobacco Use   ??? Smoking status: Current Every Day Smoker     Packs/day: 1.00     Types: Cigarettes   ??? Smokeless tobacco: Never Used   Substance Use Topics   ??? Alcohol use: No   ??? Drug use: Not on file       MEDICATIONS    Current Outpatient Medications   Medication Sig Dispense Refill   ??? aspirin (ASPIRIN) 325 mg tablet Take 325 mg by mouth daily.     ??? azaTHIOprine 100 mg tab Take 1 pill daily for 14 days then increase to 2 pills daily 60 Tab 2   ??? VENTOLIN HFA 90 mcg/actuation inhaler      ??? azaTHIOprine (IMURAN) 50 mg tablet Take 2 Tabs by mouth daily. START 1 tab daily for 14 days then 2 tabs daily 60 Tab 3   ??? predniSONE (DELTASONE) 20 mg tablet Take 20 mg by mouth daily. 30 Tab 0   ??? calcium carbonate-vitamin D3 (CALTRATE 600 PLUS D) 600 mg (1,500 mg)-800 unit chew Take 2 Tabs by mouth daily. 180 Tab 1   ??? omega 3-dha-epa-fish oil (FISH OIL) 100-160-1,000 mg cap Take  by mouth.     ??? HYDROcodone-acetaminophen (NORCO) 5-325 mg per tablet Take 1 Tab by mouth every four (4) hours as needed for Pain. Max Daily Amount: 6 Tabs. 10 Tab 0   ??? promethazine (PHENERGAN) 25 mg tablet Take 1 Tab by mouth every six (6) hours  as needed. 12 Tab 0       ALLERGIES    No Known Allergies    PHYSICAL EXAMINATION    Visit Vitals  BP 112/73 (BP 1 Location: Right arm, BP Patient Position: Sitting)   Pulse 80   Temp 98.4 ??F (36.9 ??C) (Oral)   Resp 20   Ht '5\' 11"'$  (1.803 m)   Wt 242 lb (109.8 kg)   SpO2 98%   BMI 33.75 kg/m??     Body mass index is 33.75 kg/m??.    General: NAD  HEENT: PERRL, anicteric, mild swelling noted on b/l eye lids.    Lymphatic: No cervical or axillary lymphadenopathy.  Cardiovascular: S1, S2,no R/M/G  Pulmonary: CTA b/l. No wheezes/rales/rhonchi.  Abdominal: Soft,NTND, + BS.  Skin: No rash, nodules, or periungual changes.  Neuro: Alert; able to carry normal conversation    Musculoskeletal:   Cervical & Lumbar Spine  Neck and spine have no noted deformities or signs of inflammation.  Curvature of cervical, thoracic, and lumbar spine are within normal limits.       Wrist, Hand, & Fingers  No bony deformities, inflammation, or tenderness of bony prominences. No anatomical snuff box tenderness; Full ROM in DIP, PIP, MCP, & carpal joints & with supination and pronation.       Elbow  No bony deformities, inflammation, or tenderness in olecranon, medial, lateral epicondyle elbow. Full ROM upon flexion and extension.       Shoulder  No bony deformities, inflammation, or tenderness in rotator cuff, biceps tendon, or acromioclavicular joint. Full ROM and strength in shoulder upon adduction, abduction, internal and external rotation.      Hip  No bony deformities, inflammation, or tenderness in hip joint.       Knee  FROM of the knee. NO swelling or warmth noted. No bony deformity noted    Ankle/toes  No bony deformities, inflammation or tenderness    DATA REVIEW    Prior medical records were reviewed and if applicable are summarized as below:    Labs:   10/2018: Quant gold, HBV, HCV negative, SSA/B, CCP negative  07/2018: ANCA, TSH, cbc, BMP, ACE. T. Pallidum, ESR, CRP, TPO, ANA normal, RF positive    Imaging:   CT orbit (09/2018):  normal    ASSESSMENT AND PLAN    A 63 y.o. 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 The patient's RF is positive but she has no features of sjogrens syndrome or rheumatoid arthritis. 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 The patient is very non compliant and did not take azathioprine. I counseled her today extensively about the importance of re-starting her azathioprine. She noted she will start this today.     # NSOI:  - Start on azathioprine 100 mg oral daily then increase to 200 mg in 2 weeks. Risks and benefits discussed previously  - if she needs biologic therapy, have discussion with ophtho as she might need biopsy prior to that since there was some concern for lymphoma (ophtho not worried about this).     # COPD:  - advised smoking cessation    # Medication Toxicity Monitoring:  - cbc, cmp next visit  - Hepatitis B, C: negative 10/2018  - Quant gold: negative 10/2018  - Immunizations: Discuss at next visit  - bone health: caltrate prescribed    RTC in 3 months     The patient voiced understanding of the aforementioned assessment and plan. Summary of plan was provided in the After Visit Summary patient instructions. I also provided education about MyChart setup and utility.    Ms. Galeno has a reminder for a "due or due soon" health maintenance. I have asked that she contact her primary care provider for follow-up on this health maintenance.    TODAY'S ORDERS    Orders Placed This Encounter   ??? aspirin (ASPIRIN) 325 mg tablet   ??? azaTHIOprine 100 mg tab       No future appointments.    Elodia Florence, MD    Adult Rheumatology   [facility]  A Part of [facility]  [address], [place] [address]   Phone [phone]  Fax [phone]

## 2019-05-09 NOTE — Patient Instructions (Signed)
Please fill out your [facility] survey you will receive after your visit in the mail or via MyChart!

## 2019-05-09 NOTE — Progress Notes (Signed)
REASON FOR VISIT    Patient presents for a follow up visit for    ICD-10-CM ICD-9-CM    1. Long-term use of immunosuppressant medication Z79.899 V58.69    2. 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 Uveitis H20.9 364.3        HISTORY OF DISEASE: Non specific orbital inflammation    Year of diagnosis: 2019  First visit with me: 10/2018  Cumulative disease manifestations: orbital inflammation  Positive serologies/labs: RF  Negative serologies/labs:   Other co-morbidities: + smoking  Relapses:      Past treatment history:  Prednisone: Oral tapers multiple times  Non-biologic DMARD:  Methotrexate 15 mg oral weekly (11/2018-12/2018; dc/ed due to excessive alcohol use), Azathioprine 200 mg oral daily (12/2018-present;very noncompliant)  Biologic:  Other: folic acid daily    HISTORY OF PRESENT ILLNESS     Previous medical records reviewed and summarized: yes  Review of ophtho records: eye edema responded to oral steroids and the ophthalmologist suspects non specific orbital inflammation    The patient was seen by ophthalmologist and I spoke with him (see phone note).  She did not restart the azathioprine.  She doesn't feel good on it. She is not able to expand on that.  She notes swelling at the side of her neck as well for 2.5 months.      REVIEW OF SYSTEMS    Positives as per history  Negatives as follows:  CONSTITUTlONAL:    Denies unexplained persistent fevers, weight change, chronic fatigue  HEAD/EYES:              Denies eye redness, blurry vision or sudden loss of vision, dry eyes, HA, temporal artery pain  ENT:                            Denies oral/nasal ulcers, recurrent sinus infections, dry mouth  RESPIRATORY:         No pleuritic pain, history of pleural effusions, hemoptysis, exertional dyspnea  CARDIOVASCULAR:             Denies chest pain, history of pericardial effusions  GASTRO:                    Denies heartburn, esophageal dysmotility,  abdominal pain, nausea, vomiting, diarrhea, blood in the stool  HEMATOLOGIC:        No easy bruising, purpura, swollen lymph nodes  SKIN:                           Denies alopecia, ulcers, nodules, sun sensitivity, unexplained persistent rash   VASCULAR:                Denies edema, cyanosis, raynaud phenomenon  NEUROLOGIC:           Denies specific muscle weakness, paresthesias   PSYCHIATRIC:           No sleep disturbance / snoring, depression, anxiety  MSK:                           No morning stiffness >1 hour, SI joint pain, persistent joint swelling, persistent joint pain    PAST MEDICAL HISTORY    Past Medical History:   Diagnosis Date   ??? COPD (chronic obstructive pulmonary disease) ([place])         History reviewed. No pertinent surgical history.  FAMILY HISTORY    History reviewed. No pertinent family history.    SOCIAL HISTORY    Social History     Tobacco Use   ??? Smoking status: Current Every Day Smoker     Packs/day: 1.00     Types: Cigarettes   ??? Smokeless tobacco: Never Used   Substance Use Topics   ??? Alcohol use: No   ??? Drug use: Not on file       MEDICATIONS    Current Outpatient Medications   Medication Sig Dispense Refill   ??? aspirin (ASPIRIN) 325 mg tablet Take 325 mg by mouth daily.     ??? azaTHIOprine 100 mg tab Take 1 pill daily for 14 days then increase to 2 pills daily 60 Tab 2   ??? VENTOLIN HFA 90 mcg/actuation inhaler      ??? azaTHIOprine (IMURAN) 50 mg tablet Take 2 Tabs by mouth daily. START 1 tab daily for 14 days then 2 tabs daily 60 Tab 3   ??? predniSONE (DELTASONE) 20 mg tablet Take 20 mg by mouth daily. 30 Tab 0   ??? calcium carbonate-vitamin D3 (CALTRATE 600 PLUS D) 600 mg (1,500 mg)-800 unit chew Take 2 Tabs by mouth daily. 180 Tab 1   ??? omega 3-dha-epa-fish oil (FISH OIL) 100-160-1,000 mg cap Take  by mouth.     ??? HYDROcodone-acetaminophen (NORCO) 5-325 mg per tablet Take 1 Tab by mouth every four (4) hours as needed for Pain. Max Daily Amount: 6 Tabs. 10 Tab 0    ??? promethazine (PHENERGAN) 25 mg tablet Take 1 Tab by mouth every six (6) hours as needed. 12 Tab 0       ALLERGIES    No Known Allergies    PHYSICAL EXAMINATION    Visit Vitals  BP 112/73 (BP 1 Location: Right arm, BP Patient Position: Sitting)   Pulse 80   Temp 98.4 ??F (36.9 ??C) (Oral)   Resp 20   Ht 5' 11"  (1.803 m)   Wt 242 lb (109.8 kg)   SpO2 98%   BMI 33.75 kg/m??     Body mass index is 33.75 kg/m??.    General: NAD  HEENT: PERRL, anicteric, mild swelling noted on b/l eye lids.    Lymphatic: No cervical or axillary lymphadenopathy.  Cardiovascular: S1, S2,no R/M/G  Pulmonary: CTA b/l. No wheezes/rales/rhonchi.  Abdominal: Soft,NTND, + BS.  Skin: No rash, nodules, or periungual changes.  Neuro: Alert; able to carry normal conversation    Musculoskeletal:   Cervical & Lumbar Spine  Neck and spine have no noted deformities or signs of inflammation.  Curvature of cervical, thoracic, and lumbar spine are within normal limits.       Wrist, Hand, & Fingers  No bony deformities, inflammation, or tenderness of bony prominences. No anatomical snuff box tenderness; Full ROM in DIP, PIP, MCP, & carpal joints & with supination and pronation.       Elbow  No bony deformities, inflammation, or tenderness in olecranon, medial, lateral epicondyle elbow. Full ROM upon flexion and extension.       Shoulder  No bony deformities, inflammation, or tenderness in rotator cuff, biceps tendon, or acromioclavicular joint. Full ROM and strength in shoulder upon adduction, abduction, internal and external rotation.      Hip  No bony deformities, inflammation, or tenderness in hip joint.       Knee  FROM of the knee. NO swelling or warmth noted. No bony deformity noted    Ankle/toes  No bony deformities, inflammation or tenderness    DATA REVIEW    Prior medical records were reviewed and if applicable are summarized as below:    Labs:   10/2018: Quant gold, HBV, HCV negative, SSA/B, CCP negative   07/2018: ANCA, TSH, cbc, BMP, ACE. T. Pallidum, ESR, CRP, TPO, ANA normal, RF positive    Imaging:   CT orbit (09/2018): normal    ASSESSMENT AND PLAN    A 63 y.o. 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 The patient's RF is positive but she has no features of sjogrens syndrome or rheumatoid arthritis. 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 The patient is very non compliant and did not take azathioprine. I counseled her today extensively about the importance of re-starting her azathioprine. She noted she will start this today.     # NSOI:  - Start on azathioprine 100 mg oral daily then increase to 200 mg in 2 weeks. Risks and benefits discussed previously  - if she needs biologic therapy, have discussion with ophtho as she might need biopsy prior to that since there was some concern for lymphoma (ophtho not worried about this).     # COPD:  - advised smoking cessation    # Medication Toxicity Monitoring:  - cbc, cmp next visit  - Hepatitis B, C: negative 10/2018  - Quant gold: negative 10/2018  - Immunizations: Discuss at next visit  - bone health: caltrate prescribed    RTC in 3 months     The patient voiced understanding of the aforementioned assessment and plan. Summary of plan was provided in the After Visit Summary patient instructions. I also provided education about MyChart setup and utility.    Tara Hogan has a reminder for a "due or due soon" health maintenance. I have asked that she contact her primary care provider for follow-up on this health maintenance.    TODAY'S ORDERS    Orders Placed This Encounter   ??? aspirin (ASPIRIN) 325 mg tablet   ??? azaTHIOprine 100 mg tab       No future appointments.    Elodia Florence, MD    Adult Rheumatology   [facility]  A Part of [facility]  [address] [place] [address]   Phone [phone]  Fax [phone]

## 2019-05-09 NOTE — Progress Notes (Signed)
Chief Complaint   Patient presents with   ??? Other     optic inflammation     1. Have you been to the ER, urgent care clinic since your last visit?  Hospitalized since your last visit?Yes Where: [place]    2. Have you seen or consulted any other health care providers outside of the [facility] since your last visit?  Include any pap smears or colon screening. No

## 2019-05-19 MED ORDER — AZATHIOPRINE 50 MG TAB
50 mg | ORAL_TABLET | ORAL | 2 refills | Status: AC
Start: 2019-05-19 — End: ?

## 2019-08-27 ENCOUNTER — Encounter

## 2019-08-30 NOTE — Telephone Encounter (Signed)
Formatting of this note might be different from the original.  Pt states she doesn't need a refill. Appt made to see Dr. Magda Bernheim  Electronically signed by Fonnie Mu, LPN at 16/08/9603 10:23 AM EDT

## 2019-08-30 NOTE — Telephone Encounter (Signed)
Formatting of this note might be different from the original.  Needs appointment with [facility]. Needs labs ([facility], CMP) No refills until then  Electronically signed by Skip Mayer, MD at 08/30/2019  6:12 AM EDT

## 2019-08-30 NOTE — Telephone Encounter (Signed)
Needs appointment with [facility]. Needs labs (CBCD, CMP) No refills until then

## 2019-08-30 NOTE — Telephone Encounter (Signed)
Pt states she doesn't need a refill. Appt made to see Dr. Magda Bernheim

## 2019-10-23 ENCOUNTER — Encounter: Attending: Internal Medicine | Primary: Student in an Organized Health Care Education/Training Program

## 2020-07-27 ENCOUNTER — Other Ambulatory Visit: Payer: Self-pay

## 2020-07-27 DIAGNOSIS — R0789 Other chest pain: Secondary | ICD-10-CM | POA: Diagnosis not present

## 2020-07-28 ENCOUNTER — Encounter (HOSPITAL_COMMUNITY): Payer: Self-pay

## 2020-07-28 ENCOUNTER — Emergency Department (HOSPITAL_COMMUNITY)
Admission: EM | Admit: 2020-07-28 | Discharge: 2020-07-28 | Disposition: A | Discharge status: Home / Self Care | Payer: 59 | Attending: Emergency Medicine | Admitting: Emergency Medicine

## 2020-07-28 ENCOUNTER — Emergency Department (HOSPITAL_COMMUNITY): Payer: 59

## 2020-07-28 DIAGNOSIS — R079 Chest pain, unspecified: Secondary | ICD-10-CM

## 2020-07-28 LAB — CBC
HCT: 41 % (ref 36.0–46.0)
Hemoglobin: 13.4 g/dL (ref 12.0–15.0)
MCH: 28.2 pg (ref 26.0–34.0)
MCHC: 32.7 g/dL (ref 30.0–36.0)
MCV: 86.3 fL (ref 80.0–100.0)
Platelets: 359 10*3/uL (ref 150–400)
RBC: 4.75 MIL/uL (ref 3.87–5.11)
RDW: 14.6 % (ref 11.5–15.5)
WBC: 11.5 10*3/uL — ABNORMAL HIGH (ref 4.0–10.5)
nRBC: 0 % (ref 0.0–0.2)

## 2020-07-28 LAB — BASIC METABOLIC PANEL
Anion gap: 11 (ref 5–15)
BUN: 22 mg/dL (ref 8–23)
CO2: 28 mmol/L (ref 22–32)
Calcium: 9.7 mg/dL (ref 8.9–10.3)
Chloride: 100 mmol/L (ref 98–111)
Creatinine, Ser: 0.76 mg/dL (ref 0.44–1.00)
GFR calc Af Amer: >60 mL/min (ref >60)
GFR calc non Af Amer: >60 mL/min (ref >60)
Glucose, Bld: 114 mg/dL — ABNORMAL HIGH (ref 70–99)
Potassium: 3.5 mmol/L (ref 3.5–5.1)
Sodium: 139 mmol/L (ref 135–145)

## 2020-07-28 LAB — TROPONIN I (HIGH SENSITIVITY): Troponin I (High Sensitivity): 3 ng/L (ref <18)

## 2020-07-28 MED ORDER — FAMOTIDINE 20 MG PO TABS: 20.0000 mg | ORAL_TABLET | Freq: Two times a day (BID) | ORAL | 2 refills | Status: DC

## 2020-07-28 NOTE — ED Triage Notes (Signed)
Pt reports centralized chest pain that started this evening. She states that she does have a hiatal hernia that gives her trouble, but this time it came on quicker than usual. Denies N/V. Denies SOB. A&Ox4. Ambulatory.

## 2020-07-28 NOTE — Discharge Instructions (Addendum)
As we discussed your initial work-up was reassuring.  I do recommend staying for a second troponin however as you are having no chest pain and you would prefer not to is reasonable to send you home at this time.  Please follow-up with your primary care doctor please return immediately to the emergency department for any new or concerning symptoms.  Specifically those that we discussed including nausea, vomiting, new chest pain, shortness of breath, lightheadedness dizziness or any other new or concerning symptoms.  Please follow-up with [facility].  Also provided you with a medicine for reflux.  Please use as prescribed.

## 2020-07-28 NOTE — ED Notes (Signed)
Pt discharged from this ED in stable condition at this time. All discharge instructions and follow up care reviewed with pt with no further questions at this time. Pt ambulatory with steady gait, clear speech.  

## 2020-07-28 NOTE — ED Provider Notes (Signed)
[facility]-EMERGENCY DEPT Provider Note   CSN: [id number] Arrival date & time: 07/27/20  2230     History Chief Complaint  Patient presents with  . Chest Pain    Tamara Walton is a 64 y.o. female.  HPI Patient is a 64 year old female with past medical history significant for chronic medical disease other than hypertension and hyperlipidemia both of which are treated with medications including antihypertensive and Crestor.  She is not a smoker.  Does no pertinent past family history of cardiac issues.  Has no coronary artery disease.  She is overweight.  Patient is presented today for sternal chest pain that began yesterday evening approximately an hour after she ate dinner.  She states it is been constant since that time until she came to the emergency department which point it seemed to resolve.  She states is consistent with her hiatal hernia pain.  She states she has no nausea, vomiting, shortness of breath and states that the burning chest pain and she has does not radiate to her back, shoulder or neck.  States that the pain was 6/10 at its worst but is now resolved.  She states she has no pain currently.  She states that she feels silly for coming in and would like to be discharged home.     History reviewed. No pertinent past medical history.  Patient Active Problem List   Diagnosis Date Noted  . ALLERGY UNSPECIFIED NOT ELSEWHERE CLASSIFIED 01/11/2008       OB History   No obstetric history on file.     History reviewed. No pertinent family history.  Social History   Tobacco Use  . Smoking status: Not on file  Substance Use Topics  . Alcohol use: Not on file  . Drug use: Not on file    Home Medications Prior to Admission medications   Medication Sig Start Date End Date Taking? Authorizing Provider  famotidine (PEPCID) 20 MG tablet Take 1 tablet (20 mg total) by mouth 2 (two) times daily. 07/28/20   Gailen Shelter, PA     Allergies    Patient has no known allergies.  Review of Systems   Review of Systems  Constitutional: Negative for chills and fever.  HENT: Negative for congestion.   Eyes: Negative for pain.  Respiratory: Negative for cough and shortness of breath.   Cardiovascular: Positive for chest pain. Negative for leg swelling.       CP now resoved  Gastrointestinal: Negative for abdominal pain and vomiting.  Genitourinary: Negative for dysuria.  Musculoskeletal: Negative for myalgias.  Skin: Negative for rash.  Neurological: Negative for dizziness and headaches.    Physical Exam Updated Vital Signs BP (!) 180/101 (BP Location: Left Arm)   Pulse 98   Temp 98.1 F (36.7 C) (Oral)   Resp 16   Ht 5\' 5"  (1.651 m)   Wt 99.8 kg   SpO2 99%   BMI 36.61 kg/m   Physical Exam Vitals and nursing note reviewed.  Constitutional:      General: She is not in acute distress. HENT:     Head: Normocephalic and atraumatic.     Nose: Nose normal.  Eyes:     General: No scleral icterus. Cardiovascular:     Rate and Rhythm: Normal rate and regular rhythm.     Pulses: Normal pulses.     Heart sounds: Normal heart sounds.     Comments: HR 96 Pulmonary:     Effort:  Pulmonary effort is normal. No respiratory distress.     Breath sounds: No wheezing.  Abdominal:     Palpations: Abdomen is soft.     Tenderness: There is no abdominal tenderness.  Musculoskeletal:     Cervical back: Normal range of motion.     Right lower leg: No edema.     Left lower leg: No edema.  Skin:    General: Skin is warm and dry.     Capillary Refill: Capillary refill takes less than 2 seconds.  Neurological:     Mental Status: She is alert. Mental status is at baseline.  Psychiatric:        Mood and Affect: Mood normal.        Behavior: Behavior normal.     ED Results / Procedures / Treatments   Labs (all labs ordered are listed, but only abnormal results are displayed) Labs Reviewed  BASIC METABOLIC  PANEL - Abnormal; Notable for the following components:      Result Value   Glucose, Bld 114 (*)    All other components within normal limits  CBC - Abnormal; Notable for the following components:   WBC 11.5 (*)    All other components within normal limits  TROPONIN I (HIGH SENSITIVITY)    EKG EKG Interpretation  Date/Time:  Sunday July 28 2020 00:16:44 EDT Ventricular Rate:  98 PR Interval:    QRS Duration: 105 QT Interval:  376 QTC Calculation: 481 R Axis:   7 Text Interpretation: Sinus rhythm Low voltage, precordial leads RSR' in V1 or V2, probably normal variant 12 Lead; Mason-Likar Nonspecific ST and T wave abnormality Confirmed by Glynn Octave [phone]) on 07/28/2020 12:34:32 AM   Radiology DG Chest 2 View  Result Date: 07/28/2020 CLINICAL DATA:  Chest pain EXAM: CHEST - 2 VIEW COMPARISON:  02/03/2007 FINDINGS: Heart and mediastinal contours are within normal limits. No focal opacities or effusions. No acute bony abnormality. IMPRESSION: No active cardiopulmonary disease. Electronically Signed   By: Charlett Nose M.D.   On: 07/28/2020 00:35    Procedures Procedures (including critical care time)  Medications Ordered in ED Medications - No data to display  ED Course  I have reviewed the triage vital signs and the nursing notes.  Pertinent labs & imaging results that were available during my care of the patient were reviewed by me and considered in my medical decision making (see chart for details).  Patient with various medical chest pain that is now completely resolved.  Physical exam is within normal limits.  Vital signs within normal limits apart from mildly elevated blood pressure which she states is likely because she has not taken her blood pressure medications today she has been in the waiting room for nearly 12 hours.  She is afebrile and well-appearing and she states that she is completely asymptomatic this time and her vital signs, chest x-ray, EKG CBC  troponin and BNP are all within normal limits.  Clinical Course as of Jul 28 1746  Wynelle Link Jul 28, 2020  0941 EKG with no acute abnormalities.  No evidence of ischemia.  Reviewed by Dr. Manus Gunning  Chest x-ray reviewed myself.  No acute abnormalities.  No infiltrates.  Agree of radiology read.   [WF]  0941 BMP is without electrolyte abnormality.  Glucose mildly elevated.   [WF]  [phone] No significant abnormalities very mild   CBC elevated WBC count 11.5   [WF]    Clinical Course User Index [WF] Gailen Shelter, [place]  Troponin X1 within normal limits.  I transition regularization with patient and strongly encouraged her to stay for a second troponin however she states that she would prefer not to. Patient is agreeable to discharge with follow-up with close PCP appointment.  She return to the ER for new or concerning symptoms.  Doubt PE or ACS however I did strongly encourage patient to stay for second troponin which she refused to.  The medical records were personally reviewed by myself. I personally reviewed all lab results and interpreted all imaging studies and either concurred with their official read or contacted radiology for clarification. Additional history obtained from old records  This patient appears reasonably screened and I doubt any other medical condition requiring further workup, evaluation, or treatment in the ED at this time prior to discharge.   Patient's vitals are WNL apart from vital sign abnormalities discussed above, patient is in NAD, and able to ambulate in the ED at their baseline and able to tolerate PO.  Pain has been managed or a plan has been made for home management and has no complaints prior to discharge. Patient is comfortable with above plan and for discharge at this time. All questions were answered prior to disposition. Results from the ER workup discussed with the patient face to face and all questions answered to the best of my ability. The patient is safe for  discharge with strict return precautions. Patient appears safe for discharge with appropriate follow-up. Conveyed my impression with the patient and they voiced understanding and are agreeable to plan.   An After Visit Summary was printed and given to the patient.  Portions of this note were generated with Scientist, clinical (histocompatibility and immunogenetics). Dictation errors may occur despite best attempts at proofreading.   Heart score not scored because patient has no chest pain. MDM Rules/Calculators/A&P                          Final Clinical Impression(s) / ED Diagnoses Final diagnoses:  Chest pain, unspecified type    Rx / DC Orders ED Discharge Orders         Ordered    famotidine (PEPCID) 20 MG tablet  2 times daily        07/28/20 1004           Solon Augusta [place], [place] 07/28/20 1747    Rolan Bucco, MD 07/29/20 [phone]

## 2020-10-12 ENCOUNTER — Emergency Department (HOSPITAL_COMMUNITY): Payer: 59

## 2020-10-12 ENCOUNTER — Encounter (HOSPITAL_COMMUNITY): Payer: Self-pay | Admitting: *Deleted

## 2020-10-12 ENCOUNTER — Other Ambulatory Visit: Payer: Self-pay

## 2020-10-12 ENCOUNTER — Observation Stay (HOSPITAL_COMMUNITY)
Admission: EM | Admit: 2020-10-12 | Discharge: 2020-10-14 | Disposition: A | Discharge status: Home / Self Care | Payer: 59 | Attending: Internal Medicine | Admitting: Internal Medicine

## 2020-10-12 DIAGNOSIS — R0602 Shortness of breath: Secondary | ICD-10-CM | POA: Diagnosis present

## 2020-10-12 DIAGNOSIS — Z20822 Contact with and (suspected) exposure to covid-19: Secondary | ICD-10-CM | POA: Insufficient documentation

## 2020-10-12 DIAGNOSIS — J90 Pleural effusion, not elsewhere classified: Secondary | ICD-10-CM | POA: Diagnosis not present

## 2020-10-12 DIAGNOSIS — F17210 Nicotine dependence, cigarettes, uncomplicated: Secondary | ICD-10-CM | POA: Diagnosis not present

## 2020-10-12 DIAGNOSIS — Z79899 Other long term (current) drug therapy: Secondary | ICD-10-CM | POA: Diagnosis not present

## 2020-10-12 DIAGNOSIS — J45909 Unspecified asthma, uncomplicated: Secondary | ICD-10-CM | POA: Insufficient documentation

## 2020-10-12 DIAGNOSIS — R0600 Dyspnea, unspecified: Secondary | ICD-10-CM

## 2020-10-12 DIAGNOSIS — I10 Essential (primary) hypertension: Secondary | ICD-10-CM | POA: Diagnosis not present

## 2020-10-12 DIAGNOSIS — Z9889 Other specified postprocedural states: Secondary | ICD-10-CM

## 2020-10-12 HISTORY — DX: Essential (primary) hypertension: I10

## 2020-10-12 LAB — RESPIRATORY PANEL BY RT PCR (FLU A&B, COVID)
Influenza A by PCR: NEGATIVE
Influenza B by PCR: NEGATIVE
SARS Coronavirus 2 by RT PCR: NEGATIVE

## 2020-10-12 LAB — CBC
HCT: 39.4 % (ref 36.0–46.0)
Hemoglobin: 12.6 g/dL (ref 12.0–15.0)
MCH: 27.8 pg (ref 26.0–34.0)
MCHC: 32 g/dL (ref 30.0–36.0)
MCV: 87 fL (ref 80.0–100.0)
Platelets: 533 10*3/uL — ABNORMAL HIGH (ref 150–400)
RBC: 4.53 MIL/uL (ref 3.87–5.11)
RDW: 14.3 % (ref 11.5–15.5)
WBC: 11.4 10*3/uL — ABNORMAL HIGH (ref 4.0–10.5)
nRBC: 0 % (ref 0.0–0.2)

## 2020-10-12 LAB — HEPATIC FUNCTION PANEL
ALT: 13 U/L (ref 0–44)
AST: 15 U/L (ref 15–41)
Albumin: 2.8 g/dL — ABNORMAL LOW (ref 3.5–5.0)
Alkaline Phosphatase: 112 U/L (ref 38–126)
Bilirubin, Direct: <0.1 mg/dL (ref 0.0–0.2)
Total Bilirubin: 0.4 mg/dL (ref 0.3–1.2)
Total Protein: 8 g/dL (ref 6.5–8.1)

## 2020-10-12 LAB — BASIC METABOLIC PANEL
Anion gap: 11 (ref 5–15)
BUN: 16 mg/dL (ref 8–23)
CO2: 28 mmol/L (ref 22–32)
Calcium: 9.2 mg/dL (ref 8.9–10.3)
Chloride: 97 mmol/L — ABNORMAL LOW (ref 98–111)
Creatinine, Ser: 0.71 mg/dL (ref 0.44–1.00)
GFR, Estimated: >60 mL/min (ref >60)
Glucose, Bld: 178 mg/dL — ABNORMAL HIGH (ref 70–99)
Potassium: 3.9 mmol/L (ref 3.5–5.1)
Sodium: 136 mmol/L (ref 135–145)

## 2020-10-12 LAB — TROPONIN I (HIGH SENSITIVITY)
Troponin I (High Sensitivity): 6 ng/L (ref <18)
Troponin I (High Sensitivity): 5 ng/L (ref <18)

## 2020-10-12 MED ORDER — ACETAMINOPHEN 650 MG RE SUPP: 650.0000 mg | Freq: Four times a day (QID) | RECTAL | Status: DC | PRN

## 2020-10-12 MED ORDER — FLUTICASONE PROPIONATE 50 MCG/ACT NA SUSP
1.0000 | Freq: Every day | NASAL | Status: DC
  Administered 2020-10-13 – 2020-10-14 (×2): 1 via NASAL
  Filled 2020-10-12: qty 16

## 2020-10-12 MED ORDER — POLYETHYLENE GLYCOL 3350 17 G PO PACK: 17.0000 g | PACK | Freq: Every day | ORAL | Status: DC | PRN

## 2020-10-12 MED ORDER — ENOXAPARIN SODIUM 40 MG/0.4ML ~~LOC~~ SOLN
40.0000 mg | SUBCUTANEOUS | Status: DC
  Filled 2020-10-12 (×2): qty 0.4

## 2020-10-12 MED ORDER — ACETAMINOPHEN 325 MG PO TABS: 650.0000 mg | ORAL_TABLET | Freq: Four times a day (QID) | ORAL | Status: DC | PRN

## 2020-10-12 MED ORDER — ALBUTEROL SULFATE HFA 108 (90 BASE) MCG/ACT IN AERS
2.0000 | INHALATION_SPRAY | Freq: Four times a day (QID) | RESPIRATORY_TRACT | Status: DC | PRN
  Filled 2020-10-12: qty 6.7

## 2020-10-12 MED ORDER — AMLODIPINE BESYLATE 10 MG PO TABS
10.0000 mg | ORAL_TABLET | Freq: Every day | ORAL | Status: DC
  Administered 2020-10-13 – 2020-10-14 (×2): 10 mg via ORAL
  Filled 2020-10-12 (×3): qty 1

## 2020-10-12 MED ORDER — ROSUVASTATIN CALCIUM 5 MG PO TABS
5.0000 mg | ORAL_TABLET | Freq: Every day | ORAL | Status: DC
  Administered 2020-10-13 (×2): 5 mg via ORAL
  Filled 2020-10-12 (×2): qty 1

## 2020-10-12 MED ORDER — MONTELUKAST SODIUM 10 MG PO TABS
10.0000 mg | ORAL_TABLET | Freq: Every day | ORAL | Status: DC
  Administered 2020-10-13 (×2): 10 mg via ORAL
  Filled 2020-10-12 (×4): qty 1

## 2020-10-12 MED ORDER — HYDROCHLOROTHIAZIDE 25 MG PO TABS
25.0000 mg | ORAL_TABLET | Freq: Every day | ORAL | Status: DC
  Administered 2020-10-13 – 2020-10-14 (×2): 25 mg via ORAL
  Filled 2020-10-12 (×3): qty 1

## 2020-10-12 MED ORDER — IOHEXOL 350 MG/ML SOLN
100.0000 mL | Freq: Once | INTRAVENOUS | Status: AC | PRN
  Administered 2020-10-12: 100 mL via INTRAVENOUS

## 2020-10-12 NOTE — Progress Notes (Signed)
CRITICAL VALUE ALERT  Critical Value:  Troponin 202 Date & Time Notied:  10/12/2020 1725  Provider Notified: 10/12/2020 1725  Orders Received/Actions taken:

## 2020-10-12 NOTE — ED Notes (Signed)
Patient denies pain and is resting comfortably.  

## 2020-10-12 NOTE — ED Triage Notes (Signed)
The pt has been to [facility] for one week  She had a chest xray there and was told that one of her lungs was filled with fluid and to come here

## 2020-10-12 NOTE — ED Notes (Signed)
Attempted report 

## 2020-10-12 NOTE — ED Notes (Addendum)
Lab called to notify of pt's repeat troponin results of 5. Lab said the reprocessed the previous troponin collected at 1626 and checked that it was labeled correctly since the initial result was 202. Notified Shawn PA.

## 2020-10-12 NOTE — Care Management (Signed)
Assigned to [facility] for Primary care MD, needs to call on Monday to set up a appointment.

## 2020-10-12 NOTE — ED Notes (Signed)
Pt back from CT

## 2020-10-12 NOTE — ED Notes (Signed)
Pt gone to CT 

## 2020-10-12 NOTE — Hospital Course (Addendum)
#  Pleural effusion Patient with hx of adult onset asthma presented with cough and SOB and found to have left sided pleural effusion on imaging. Tachycardic and hypertensive on arrival. Exam showed bibasilar rales (L>R), no LE edema or JVD. CBC showed mild leukocytosis and thrombocytosis likely reactive in nature. BMP and troponins normal. EKG shows sinus tach and CXR with evidence of mod-large left-sided pleural effusion. CT chest/angio neg for PE, but confirms pleural effusion. Had image-guided left thoracentesis, which yielded 1.1 liters of hazy gold fluid. Procedure was stopped after 1.1 liters per patient request secondary to patient's symptoms (chest pain, coughing). Chest x-ray post procedure shows improvement in left pleural effusion. No pneumothorax ost thoracentesis. Echo cardiogram was performed and it reads normal.  considered exudative if it meets 1 of Light's criteria. She meets all 3- a). Pleural fluid protein/ Serum protein 7.2= 0.68 b). Pleural fluid LDH 159/ Serum LDH 133=1.19 c). Pleural fluid LDH> 2/3 Serum LDH.Has a long h/o smoking, parents deceased from lung cancer. At this point it can be possibly malignancy. Chest x-ray & CT scan were performed. Dr. Mosetta Putt from oncology was consulted for any suggested further work up. Patient would be setting up appointment for mammogram soon. She tried getting appointment with a new PCP but due to unavailability she would be following up at [facility] clinic.   #HTN Patient has a hx of uncontrolled hypertension on amlodipine and HCTZ at home. Found to be hypertensive to systolic in 150s. Normal kidney function. BP trended down with Amlodipine 10 mg daily & HCTZ 25 mg daily    #HLD Chronic and stable. Continued on home Crestor 5 mg daily    #Allergic rhinitis #Adult onset Asthma Reports chronic history and mostly controlled. She has symptoms about twice a year. Has albuterol and Flonase at home but only need albuterol about 1-2 times a year.      #Multinodular thyroid gland  Found incidentally on CTA. The largest nodule measuring up to 1.3 cm within the right thyroid lobe. Not clinically significant per radiology; no follow-up imaging recommended

## 2020-10-12 NOTE — ED Notes (Signed)
Unhook patient at 10:08pm to use the bathroom and hooked her back up at 10:11

## 2020-10-12 NOTE — ED Provider Notes (Signed)
[facility] EMERGENCY DEPARTMENT Provider Note   CSN: [id number] Arrival date & time: 10/12/20  1557     History Chief Complaint  Patient presents with  . Shortness of Breath    Tamara Walton is a 65 y.o. female.  HPI      Tamara Walton is a 64 y.o. female, with a history of HTN, presenting to the ED with cough and shortness of breath.  Symptoms originally began around Nov 1, prescribed prednisone, sx improved, but then started to worsen again around Monday Nov 15.  She had a chest x-ray performed which showed left-sided pleural effusion.  Denies fever/chills, chest pain, abdominal pain, hemoptysis, lower extremity edema/pain, N/V/D, syncope, or any other complaints.   Past Medical History:  Diagnosis Date  . Hypertension     Patient Active Problem List   Diagnosis Date Noted  . ALLERGY UNSPECIFIED NOT ELSEWHERE CLASSIFIED 01/11/2008    History reviewed. No pertinent surgical history.   OB History   No obstetric history on file.     Family History  Problem Relation Age of Onset  . Lung cancer Mother 55  . Lung cancer Father 80    Social History   Tobacco Use  . Smoking status: Current Every Day Smoker    Packs/day: 1.00  . Smokeless tobacco: Never Used  Substance Use Topics  . Alcohol use: Never  . Drug use: Not on file    Home Medications Prior to Admission medications   Medication Sig Start Date End Date Taking? Authorizing Provider  acetaminophen (TYLENOL) 500 MG tablet Take 1,000 mg by mouth every 6 (six) hours as needed for headache (pain).   Yes [provider]  albuterol (VENTOLIN HFA) 108 (90 Base) MCG/ACT inhaler Inhale 2 puffs into the lungs every 6 (six) hours as needed for wheezing or shortness of breath.   Yes [provider]  amLODipine (NORVASC) 5 MG tablet Take 5 mg by mouth daily. 10/07/20  Yes [provider]  Calcium Carbonate Antacid (TUMS PO) Take 1 tablet by mouth 2 (two) times  daily as needed (acid reflux).   Yes [provider]  fluticasone (FLONASE) 50 MCG/ACT nasal spray Place 1 spray into both nostrils daily.   Yes [provider]  hydrochlorothiazide (HYDRODIURIL) 25 MG tablet Take 25 mg by mouth daily.   Yes [provider]  montelukast (SINGULAIR) 10 MG tablet Take 10 mg by mouth at bedtime. 06/03/20  Yes [provider]  rosuvastatin (CRESTOR) 5 MG tablet Take 5 mg by mouth at bedtime. 08/27/20  Yes [provider]  famotidine (PEPCID) 20 MG tablet Take 1 tablet (20 mg total) by mouth 2 (two) times daily. Patient not taking: Reported on 10/12/2020 07/28/20   Gailen Shelter, PA    Allergies    Erythromycin, Strawberry (diagnostic), Amoxicillin-pot clavulanate, and Lisinopril  Review of Systems   Review of Systems  Constitutional: Negative for chills, diaphoresis and fever.  Respiratory: Positive for cough and shortness of breath.   Cardiovascular: Negative for chest pain and leg swelling.  Gastrointestinal: Negative for abdominal pain, constipation, diarrhea, nausea and vomiting.  Musculoskeletal: Negative for back pain.  Neurological: Negative for dizziness, syncope, weakness and numbness.  All other systems reviewed and are negative.   Physical Exam Updated Vital Signs BP (!) 158/96   Pulse (!) 118   Temp 98.4 F (36.9 C) (Oral)   Resp (!) 26   Ht 5\' 5"  (1.651 m)   Wt 102.1 kg  SpO2 95%   BMI 37.46 kg/m   Physical Exam Vitals and nursing note reviewed.  Constitutional:      General: She is not in acute distress.    Appearance: She is well-developed. She is not diaphoretic.  HENT:     Head: Normocephalic and atraumatic.     Mouth/Throat:     Mouth: Mucous membranes are moist.     Pharynx: Oropharynx is clear.  Eyes:     Conjunctiva/sclera: Conjunctivae normal.  Cardiovascular:     Rate and Rhythm: Regular rhythm. Tachycardia present.     Pulses: Normal pulses.          Radial pulses are  2+ on the right side and 2+ on the left side.       Posterior tibial pulses are 2+ on the right side and 2+ on the left side.     Heart sounds: Normal heart sounds.     Comments: Tactile temperature in the extremities appropriate and equal bilaterally. Pulmonary:     Effort: Tachypnea present.     Breath sounds: Examination of the left-middle field reveals decreased breath sounds. Examination of the left-lower field reveals decreased breath sounds. Decreased breath sounds present.  Abdominal:     Palpations: Abdomen is soft.     Tenderness: There is no abdominal tenderness. There is no guarding.  Musculoskeletal:     Cervical back: Neck supple.     Right lower leg: No edema.     Left lower leg: No edema.  Lymphadenopathy:     Cervical: No cervical adenopathy.  Skin:    General: Skin is warm and dry.  Neurological:     Mental Status: She is alert.  Psychiatric:        Mood and Affect: Mood and affect normal.        Speech: Speech normal.        Behavior: Behavior normal.     ED Results / Procedures / Treatments   Labs (all labs ordered are listed, but only abnormal results are displayed) Labs Reviewed  BASIC METABOLIC PANEL - Abnormal; Notable for the following components:      Result Value   Chloride 97 (*)    Glucose, Bld 178 (*)    All other components within normal limits  CBC - Abnormal; Notable for the following components:   WBC 11.4 (*)    Platelets 533 (*)    All other components within normal limits  HEPATIC FUNCTION PANEL - Abnormal; Notable for the following components:   Albumin 2.8 (*)    All other components within normal limits  RESPIRATORY PANEL BY RT PCR (FLU A&B, COVID)  TROPONIN I (HIGH SENSITIVITY)  TROPONIN I (HIGH SENSITIVITY)    EKG EKG Interpretation  Date/Time:  Saturday October 12 2020 16:02:28 EST Ventricular Rate:  130 PR Interval:  158 QRS Duration: 84 QT Interval:  292 QTC Calculation: 429 R Axis:   19 Text Interpretation: Sinus  tachycardia Low voltage QRS Cannot rule out Anterior infarct , age undetermined ST & T wave abnormality, consider lateral ischemia Abnormal ECG since last tracing no significant change Confirmed by Rolan Bucco [phone]) on 10/12/2020 7:49:04 PM   Radiology DG Chest 2 View  Result Date: 10/12/2020 CLINICAL DATA:  Shortness of breath.  Concern for fluid on lungs. EXAM: CHEST - 2 VIEW COMPARISON:  July 28, 2020 FINDINGS: No pneumothorax. The right lung is clear. There is a moderate to large left-sided pleural effusion filling greater than 50% of the left  chest. The cardiomediastinal silhouette is unchanged within visualize limits. The left side of the heart left hilum are not well evaluated. No other acute abnormalities. IMPRESSION: New moderate to large left-sided pleural effusion with underlying opacity. No other abnormalities. Electronically Signed   By: Gerome Sam III M.D   On: 10/12/2020 17:08   CT Angio Chest PE W and/or Wo Contrast  Result Date: 10/12/2020 CLINICAL DATA:  Shortness of breath, pleural effusion EXAM: CT ANGIOGRAPHY CHEST WITH CONTRAST TECHNIQUE: Multidetector CT imaging of the chest was performed using the standard protocol during bolus administration of intravenous contrast. Multiplanar CT image reconstructions and MIPs were obtained to evaluate the vascular anatomy. CONTRAST:  OMNIPAQUE IOHEXOL 350 MG/ML SOLN COMPARISON:  X-ray 10/12/2020 FINDINGS: Cardiovascular: Satisfactory opacification of the pulmonary arteries. No evidence of filling defect to suggest pulmonary embolism. Thoracic aorta is normal in course and caliber. Three vessel arch. Normal heart size. No pericardial effusion. Mediastinum/Nodes: No axillary or mediastinal lymphadenopathy. No right hilar lymphadenopathy. Evaluation of the left hilum is somewhat limited although no obvious hilar mass is identified. Multinodular thyroid gland with largest nodule measuring up to 1.3 cm within the right thyroid  lobe. Trachea and esophagus within normal limits. Lungs/Pleura: Large left pleural effusion. There is atelectasis of the majority of the left lower lobes as well as the inferior segment of the left upper lobe. There is ground-glass attenuation within the aerated portion of the left upper lobe. No right-sided pleural effusion. Faint ground-glass attenuation within the right perihilar region. No discrete pulmonary nodules or masses. No pneumothorax. Upper Abdomen: No acute abnormality. Musculoskeletal: No chest wall abnormality. No acute or significant osseous findings. Review of the MIP images confirms the above findings. IMPRESSION: 1. No evidence of pulmonary embolism. 2. Large left pleural effusion with atelectasis of the majority of the left lower lobe as well as the inferior segment of the left upper lobe. 3. Ground-glass attenuation within the aerated portion of the left upper lobe and right perihilar region suspicious for edema. 4. Multinodular thyroid gland with largest nodule measuring up to 1.3 cm within the right thyroid lobe. Not clinically significant; no follow-up imaging recommended (ref: J Am Coll Radiol. 2015 Feb;12(2): 143-50). Electronically Signed   By: Duanne Guess D.O.   On: 10/12/2020 20:21    Procedures Procedures (including critical care time)  Medications Ordered in ED Medications  iohexol (OMNIPAQUE) 350 MG/ML injection 100 mL (100 mLs Intravenous Contrast Given 10/12/20 1953)    ED Course  I have reviewed the triage vital signs and the nursing notes.  Pertinent labs & imaging results that were available during my care of the patient were reviewed by me and considered in my medical decision making (see chart for details).  Clinical Course as of Oct 12 2156  Sat Oct 12, 2020  2038 RN states she received a call from the lab. They reportedly told her that the patient's most recent troponin returned at 5, which did not seem to fit with the previously reported value of 202  so they reran the previous sample.   [SJ]  2046 Spoke with Verlon Au at the main lab to obtain more information on the patient's troponin. She confirms that both the previous sample and the most recent sample were both labeled correctly for the patient.  Since they noted the most recent troponin was negative, they decided to rebound the 1st blood sample and noted it to be 6, rather than 202. To confirm that the blood seem to come from  the same patient with both samples, they ran BMPs on both samples and the results were almost identical.   [SJ]  2047 Dr. Fredderick PhenixBelfi spoke with IM resident. Agrees to admit the patient.   [SJ]    Clinical Course User Index [SJ] Wen Munford, Hillard DankerShawn C, PA-C   MDM Rules/Calculators/A&P                           Patient presents with cough and shortness of breath as well as a chest x-ray that showed pleural effusion on the left. Afebrile, nontoxic-appearing, but with tachycardia and some tachypnea.  No hypoxia. I personally reviewed and interpreted the patient's labs and imaging studies. Confirmation of pleural effusion on chest x-ray here and CT without definitive cause. Patient admitted for further management and evaluation.  Findings and plan of care discussed with Rolan BuccoMelanie Belfi, MD. Dr. Fredderick PhenixBelfi personally evaluated and examined this patient.  Final Clinical Impression(s) / ED Diagnoses Final diagnoses:  Pleural effusion    Rx / DC Orders ED Discharge Orders    None       Concepcion LivingJoy, Arabell Neria C, PA-C 10/12/20 2157    Rolan BuccoBelfi, Melanie, MD 10/13/20 [phone]

## 2020-10-13 ENCOUNTER — Inpatient Hospital Stay (HOSPITAL_COMMUNITY): Payer: 59

## 2020-10-13 ENCOUNTER — Other Ambulatory Visit: Payer: Self-pay

## 2020-10-13 ENCOUNTER — Encounter (HOSPITAL_COMMUNITY): Payer: Self-pay | Admitting: Internal Medicine

## 2020-10-13 DIAGNOSIS — J90 Pleural effusion, not elsewhere classified: Secondary | ICD-10-CM

## 2020-10-13 DIAGNOSIS — J309 Allergic rhinitis, unspecified: Secondary | ICD-10-CM | POA: Diagnosis not present

## 2020-10-13 DIAGNOSIS — R0602 Shortness of breath: Secondary | ICD-10-CM | POA: Diagnosis not present

## 2020-10-13 DIAGNOSIS — E041 Nontoxic single thyroid nodule: Secondary | ICD-10-CM

## 2020-10-13 DIAGNOSIS — E785 Hyperlipidemia, unspecified: Secondary | ICD-10-CM

## 2020-10-13 LAB — LACTATE DEHYDROGENASE, PLEURAL OR PERITONEAL FLUID: LD, Fluid: 159 U/L — ABNORMAL HIGH (ref 3–23)

## 2020-10-13 LAB — VITAMIN B12: Vitamin B-12: 165 pg/mL — ABNORMAL LOW (ref 180–914)

## 2020-10-13 LAB — BODY FLUID CELL COUNT WITH DIFFERENTIAL
Eos, Fluid: 0 %
Lymphs, Fluid: 47 %
Monocyte-Macrophage-Serous Fluid: 21 % — ABNORMAL LOW (ref 50–90)
Neutrophil Count, Fluid: 32 % — ABNORMAL HIGH (ref 0–25)
Total Nucleated Cell Count, Fluid: 3740 cu mm — ABNORMAL HIGH (ref 0–1000)

## 2020-10-13 LAB — COMPREHENSIVE METABOLIC PANEL
ALT: 11 U/L (ref 0–44)
AST: 13 U/L — ABNORMAL LOW (ref 15–41)
Albumin: 2.7 g/dL — ABNORMAL LOW (ref 3.5–5.0)
Alkaline Phosphatase: 110 U/L (ref 38–126)
Anion gap: 10 (ref 5–15)
BUN: 14 mg/dL (ref 8–23)
CO2: 27 mmol/L (ref 22–32)
Calcium: 9 mg/dL (ref 8.9–10.3)
Chloride: 98 mmol/L (ref 98–111)
Creatinine, Ser: 0.88 mg/dL (ref 0.44–1.00)
GFR, Estimated: >60 mL/min (ref >60)
Glucose, Bld: 105 mg/dL — ABNORMAL HIGH (ref 70–99)
Potassium: 3.9 mmol/L (ref 3.5–5.1)
Sodium: 135 mmol/L (ref 135–145)
Total Bilirubin: 0.7 mg/dL (ref 0.3–1.2)
Total Protein: 7.2 g/dL (ref 6.5–8.1)

## 2020-10-13 LAB — BASIC METABOLIC PANEL
Anion gap: 9 (ref 5–15)
BUN: 11 mg/dL (ref 8–23)
CO2: 29 mmol/L (ref 22–32)
Calcium: 8.9 mg/dL (ref 8.9–10.3)
Chloride: 98 mmol/L (ref 98–111)
Creatinine, Ser: 0.74 mg/dL (ref 0.44–1.00)
GFR, Estimated: >60 mL/min (ref >60)
Glucose, Bld: 109 mg/dL — ABNORMAL HIGH (ref 70–99)
Potassium: 3.5 mmol/L (ref 3.5–5.1)
Sodium: 136 mmol/L (ref 135–145)

## 2020-10-13 LAB — LIPID PANEL
Cholesterol: 254 mg/dL — ABNORMAL HIGH (ref 0–200)
HDL: 29 mg/dL — ABNORMAL LOW (ref >40)
LDL Cholesterol: 184 mg/dL — ABNORMAL HIGH (ref 0–99)
Total CHOL/HDL Ratio: 8.8 RATIO
Triglycerides: 205 mg/dL — ABNORMAL HIGH (ref <150)
VLDL: 41 mg/dL — ABNORMAL HIGH (ref 0–40)

## 2020-10-13 LAB — PROTEIN, PLEURAL OR PERITONEAL FLUID: Total protein, fluid: 4.9 g/dL

## 2020-10-13 LAB — HIV ANTIBODY (ROUTINE TESTING W REFLEX): HIV Screen 4th Generation wRfx: NONREACTIVE

## 2020-10-13 LAB — CBC
HCT: 35.4 % — ABNORMAL LOW (ref 36.0–46.0)
Hemoglobin: 11.4 g/dL — ABNORMAL LOW (ref 12.0–15.0)
MCH: 27 pg (ref 26.0–34.0)
MCHC: 32.2 g/dL (ref 30.0–36.0)
MCV: 83.9 fL (ref 80.0–100.0)
Platelets: 468 10*3/uL — ABNORMAL HIGH (ref 150–400)
RBC: 4.22 MIL/uL (ref 3.87–5.11)
RDW: 14.2 % (ref 11.5–15.5)
WBC: 9.3 10*3/uL (ref 4.0–10.5)
nRBC: 0 % (ref 0.0–0.2)

## 2020-10-13 LAB — ECHOCARDIOGRAM COMPLETE
Height: 65 in
S' Lateral: 3.3 cm
Weight: 3601.43 oz

## 2020-10-13 LAB — LACTATE DEHYDROGENASE: LDH: 133 U/L (ref 98–192)

## 2020-10-13 LAB — GLUCOSE, PLEURAL OR PERITONEAL FLUID: Glucose, Fluid: 113 mg/dL

## 2020-10-13 LAB — HEMOGLOBIN A1C
Hgb A1c MFr Bld: 5.9 % — ABNORMAL HIGH (ref 4.8–5.6)
Mean Plasma Glucose: 122.63 mg/dL

## 2020-10-13 MED ORDER — LIDOCAINE HCL (PF) 1 % IJ SOLN
INTRAMUSCULAR | Status: AC
  Filled 2020-10-13: qty 30

## 2020-10-13 NOTE — Progress Notes (Signed)
  Echocardiogram 2D Echocardiogram has been performed.  Delcie Roch 10/13/2020, 11:04 AM

## 2020-10-13 NOTE — H&P (Signed)
Date: 10/12/2020               Patient Name:  Tamara Walton MRN: [id number]  DOB: Nov 29, 1955 Age / Sex: 64 y.o., female   PCP: Patient, No Pcp Per         Medical Service: Internal Medicine Teaching Service         Attending Physician: Dr. Mayford Knife, Dorene Ar, MD    First Contact: Dr. Geralynn Rile Dagar Pager: [phone]  Second Contact: Dr. Eliezer Bottom Pager: [phone]       After Hours (After 5p/  First Contact Pager: [phone]  weekends / holidays): Second Contact Pager: [phone]   Chief Complaint: Cough/SOB  History of Present Illness: Tamara Walton is a 64 y.o. female with PMH of HTN, HLD, allergic rhinitis and adult onset asthma who presenting to the ED with cough and shortness of breath. Patient states her symptoms originally began around the beginning of the month. She saw her PCP via a virtual visit and prescribed prednisone 10 mg taper. Her symptoms improved initially, but a week ago, she started having worsening SOB and cough. She was seen today at [facility] and a CXR performed which showed left-sided pleural effusion. She was sent to [facility] for further evaluation and management of her pleural effusion. Patient endorse some dyspnea on exertion and feeling fatigue "no energy" but denies fever/chills, chest pain, abdominal pain, hemoptysis, back pain, lower extremity edema/pain, N/V/D, sweats or palpitations.   PCP is Jordan Hawks, PA-C with [facility]  Meds:  Current Meds  Medication Sig  . acetaminophen (TYLENOL) 500 MG tablet Take 1,000 mg by mouth every 6 (six) hours as needed for headache (pain).  Marland Kitchen albuterol (VENTOLIN HFA) 108 (90 Base) MCG/ACT inhaler Inhale 2 puffs into the lungs every 6 (six) hours as needed for wheezing or shortness of breath.  Marland Kitchen amLODipine (NORVASC) 5 MG tablet Take 5 mg by mouth daily.  . Calcium Carbonate Antacid (TUMS PO) Take 1 tablet by mouth 2 (two) times daily as needed (acid reflux).  .  fluticasone (FLONASE) 50 MCG/ACT nasal spray Place 1 spray into both nostrils daily.  . hydrochlorothiazide (HYDRODIURIL) 25 MG tablet Take 25 mg by mouth daily.  . montelukast (SINGULAIR) 10 MG tablet Take 10 mg by mouth at bedtime.  . rosuvastatin (CRESTOR) 5 MG tablet Take 5 mg by mouth at bedtime.     Allergies: Allergies as of 10/12/2020 - Review Complete 10/12/2020  Allergen Reaction Noted  . Erythromycin Nausea And Vomiting 07/21/2012  . Strawberry (diagnostic) Hives 10/12/2020  . Amoxicillin-pot clavulanate Nausea And Vomiting and Nausea Only 09/29/2013  . Lisinopril Cough 09/27/2018   Past Medical History:  Diagnosis Date  . Hypertension     Family History: Significant for mother and father who died from lung cancer.   Social History: Lives by herself. She works in an office. She smoked a 1ppd of cigarettes for about 20 years and quit 13 years ago. She denies any ETOH or illicit drug use.   Review of Systems: A complete ROS was negative except as per HPI.   Physical Exam: Blood pressure (!) 158/96, pulse (!) 118, temperature 98.4 F (36.9 C), temperature source Oral, resp. rate (!) 26, height 5\' 5"  (1.651 m), weight 102.1 kg, SpO2 95 %.  General: Pleasant, well-appearing elderly woman laying in bed. No acute distress. Head: Normocephalic. Atraumatic. CV: Tachycardic. Regular Rhythm. No murmurs, rubs, or gallops. No LE edema Pulmonary: Lungs  CTAB. Normal effort. No wheezing. Mild bibasilar rales. Abdominal: Soft, nontender, nondistended. Normal bowel sounds. Extremities: Palpable pulses. Normal ROM. Skin: Warm and dry. No obvious rash or lesions. Neuro: A&Ox3. Moves all extremities. Normal sensation. No focal deficit. Psych: Normal mood and affect  EKG: personally reviewed my interpretation is Sinus tach  CXR: personally reviewed my interpretation is moderate to large left-sided pleural effusion with underlying opacity.  Assessment & Plan by Problem: Principal  Problem:   Pleural effusion  #Pleural effusion Patient with hx of adult onset asthma presented with cough and SOB and found to have left sided pleural effusion on imaging. Tachycardic and hypertensive on arrival. Exam showed bibasilar rales (L>R), no LE edema or JVD. CBC showed mild leukocytosis and thrombocytosis likely reactive in nature. BMP and troponins normal. EKG shows sinus tach and CXR with evidence of mod-large left-sided pleural effusion. CT chest/angio neg for PE, but confirms pleural effusion. Differential for undifferentiated pleural effusion includes hypoproteinemia (cirrhosis, hypoalbuminemia, nephrotic syndrome), CHF, infection (pneumonia, TB), PE, malignancy or connective tissue dz (CTD). No signs or symptoms of cirrhosis, malignancy or CTD, and no signs of PE, TB or PNA on imaging. No previous TTE to evaluate for HF. Evidence of hypoalbuminemia on labs with protein gap. Pleural efusion likely 2/2 to heart failure due to hypertensive cardiomyopathy or hypoproteinemia from low albumin or nephrotic syndrome. Will need tap to further differentiate transudate vs. Exudate effusion. Patient on RA and hemodynamically stable.  --Consult IR for pleurocentesis --ECHO --Microalbumin/creatine urine ratio --F/u AM CBC, BMP --O2 via [place] as needed  #HTN Patient has a hx of uncontrolled hypertension on amlodipine and HCTZ at home. Found to be hypertensive to systolic in 150s. Normal kidney function. BP trending down. Continue to monitor. --Amlodipine 10 mg daily --HCTZ 25 mg daily --Daily vitals --Consider a combo agent at d/c  #HLD Chronic and stable.  --Continue home Crestor 5 mg daily --F/u lipid panel  #Allergic rhinitis #Adult onset Asthma Reports chronic history and mostly controlled. She has symptoms about twice a year. Has albuterol and Flonase at home but only need albuterol about 1-2 times a year.  --Albuterol inhaler  --Singular 10 mg daily at bedtime --Pulse  ox  #Multinodular thyroid gland  Found incidentally on CTA. The largest nodule measuring up to 1.3 cm within the right thyroid lobe.  --Not clinically significant per radiology; no follow-up imaging recommended  CODE STATUS: Full Code DIET: 2 gram Sodium PPx: Lovenox 40 mg subcu daily  Dispo: Admit patient to Inpatient with expected length of stay greater than 2 midnights.  Signed: Steffanie Rainwater, MD 10/12/2020, 8:55 PM  Pager: [phone] Internal Medicine Teaching Service After 5pm on weekdays and 1pm on weekends: On Call pager: [phone]

## 2020-10-13 NOTE — Progress Notes (Signed)
Report received. Room is ready.  

## 2020-10-13 NOTE — Plan of Care (Signed)
  Problem: Clinical Measurements: Goal: Diagnostic test results will improve Outcome: Completed/Met Goal: Cardiovascular complication will be avoided Outcome: Completed/Met   Problem: Activity: Goal: Risk for activity intolerance will decrease Outcome: Completed/Met   Problem: Nutrition: Goal: Adequate nutrition will be maintained Outcome: Completed/Met   Problem: Coping: Goal: Level of anxiety will decrease Outcome: Completed/Met   Problem: Pain Managment: Goal: General experience of comfort will improve Outcome: Completed/Met   Problem: Skin Integrity: Goal: Risk for impaired skin integrity will decrease Outcome: Completed/Met

## 2020-10-13 NOTE — Procedures (Signed)
PROCEDURE SUMMARY:  Successful image-guided left thoracentesis. Yielded 1.1 liters of hazy gold fluid. Procedure was stopped after 1.1 liters per patient request secondary to patient's symptoms (chest pain, coughing). Patient tolerated procedure well. No immediate complications. EBL < 1 mL.  Specimen was sent for labs. CXR ordered.  Please see imaging section of Epic for full dictation.   Gordy Councilman Aris Moman PA-C 10/13/2020 10:02 AM

## 2020-10-13 NOTE — Progress Notes (Signed)
   Subjective: HD#1 Patient evaluated at bedside following her thoracentesis. She endorses having some continued cough and mild pain at the site of thoracentesis. She does endorse having improvement in her breathing following 1.1L off.  Endorses mild headache but reports that she just needs to settle down and rest a bit. Denies any abdominal pain. States not planning to see her PCP again and going to follow up with PCP seeing her brother.  Objective:  Vital signs in last 24 hours: Vitals:   10/13/20 0721 10/13/20 0832 10/13/20 0952 10/13/20 1101  BP: 125/80 132/89 117/80 (!) 141/88  Pulse: (!) 115   93  Resp: 17   20  Temp: 98.4 F (36.9 C)   97.9 F (36.6 C)  TempSrc: Oral   Oral  SpO2: 95%   93%  Weight:      Height:       General:Well developed, well-appearing elderly woman sitting in bed. No acute distress. Head:[place]/NT CV: Tachycardic. Regular Rhythm.No murmurs, rubs, or gallops. No LE edema Pulmonary:Lungs CTAB. Normal effort. No wheezing. Mild bibasilar rales. Abdominal:Soft,nontender,nondistended. Normal bowel sounds. Extremities: Palpable pulses. Normal ROM. Skin:Warm and dry. No obvious rash or lesions. Neuro:AAOx3 Psych: Normal mood and affect  Assessment/Plan:  Ms. Tamara Walton a 64 y.o.female with PMH ofHTN, HLD, allergic rhinitis and adult onset asthma admitted for pleural effusion. Principal Problem:   Pleural effusion  #Pleural effusion Patient had image-guided left thoracentesis, which yielded 1.1 liters of hazy gold fluid. Procedure was stopped after 1.1 liters per patient request secondary to patient's symptoms (chest pain, coughing). Chest x-ray post procedure shows improvement in left pleural effusion. No pneumothorax ost thoracentesis. Echo cardiogram was performed and it reads normal. Waiting on pleural fluid results to explore the possible reason for this pleural effusion.  --Follow pleural fluid results --O2 via [place] as  needed  #HTN Patient has a hx of uncontrolled hypertension on amlodipine and HCTZ at home. Found to be hypertensive to systolic in 150s. Normal kidney function. BP trending down. Continue to monitor. --Amlodipine 10 mg daily --HCTZ 25 mg daily --Daily vitals --Consider a combo agent at d/c  #HLD Chronic and stable.  --Continue home Crestor 5 mg daily --F/u lipid panel  #Allergic rhinitis #Adult onset Asthma Reports chronic history and mostly controlled. She has symptoms about twice a year. Has albuterol and Flonase at home but only need albuterol about 1-2 times a year.  --Albuterol inhaler  --Singular 10 mg daily at bedtime --Pulse ox  #Multinodular thyroid gland  Found incidentally on CTA. The largest nodule measuring up to 1.3 cm within the right thyroid lobe.  --Not clinically significant per radiology; no follow-up imaging recommended.  Prior to Admission Living Arrangement: Home Anticipated Discharge Location: Home Barriers to Discharge: Waiting lab results Dispo: Anticipated discharge in approximately 1 day(s).   Arnoldo Lenis, MD 10/13/2020, 2:27 PM Pager: [phone] After 5pm on weekdays and 1pm on weekends: On Call pager [phone]

## 2020-10-13 NOTE — Progress Notes (Signed)
New Admission Note:  Arrival Method: Stretcher Mental Orientation: Alert and oriented x 4 Telemetry: N/A Assessment: Completed Skin: WNL IV: NSL Pain: Denies Tubes: N/A Safety Measures: Safety Fall Prevention Plan initiated.  Admission: Completed 5 M  Orientation: Patient has been orientated to the room, unit and the staff. Welcome booklet given.  Family: None  Orders have been reviewed and implemented. Will continue to monitor the patient. Call light has been placed within reach and bed alarm has been activated.   Guilford Shi BSN, RN  Phone Number: [phone]

## 2020-10-14 ENCOUNTER — Telehealth: Payer: Self-pay

## 2020-10-14 DIAGNOSIS — J309 Allergic rhinitis, unspecified: Secondary | ICD-10-CM | POA: Diagnosis not present

## 2020-10-14 DIAGNOSIS — E041 Nontoxic single thyroid nodule: Secondary | ICD-10-CM | POA: Diagnosis not present

## 2020-10-14 DIAGNOSIS — E785 Hyperlipidemia, unspecified: Secondary | ICD-10-CM | POA: Diagnosis not present

## 2020-10-14 DIAGNOSIS — J90 Pleural effusion, not elsewhere classified: Secondary | ICD-10-CM | POA: Diagnosis not present

## 2020-10-14 LAB — CD19 AND CD20, FLOW CYTOMETRY

## 2020-10-14 LAB — FLOW CYTOMETRY REQUEST - FLUID (INPATIENT)

## 2020-10-14 NOTE — Progress Notes (Signed)
DISCHARGE NOTE HOME  Tamara Walton to be discharged Home per MD order. Discussed prescriptions and follow up appointments with the patient. Prescriptions given to patient; medication list explained in detail. Patient verbalized understanding.  Skin clean, dry and intact without evidence of skin break down, no evidence of skin tears noted. IV catheter discontinued intact. Site without signs and symptoms of complications. Dressing and pressure applied. Pt denies pain at the site currently. No complaints noted.  Patient free of lines, drains, and wounds.   An After Visit Summary (AVS) was printed and given to the patient. Patient escorted via wheelchair, and discharged home via private auto.  Pat Patrick, RN

## 2020-10-14 NOTE — Telephone Encounter (Signed)
Hospital TOC per Dr. Gerarda Fraction, discharge 10/14/2020, appt 10/21/2020.

## 2020-10-14 NOTE — Discharge Summary (Signed)
Name: Tamara Walton MRN: [id number] DOB: [birth year] 64 y.o. PCP: No primary care provider on file.  Date of Admission: 10/12/2020  4:00 PM Date of Discharge: 10/14/2020 Attending Physician: Dr. Mayford Knife Discharge Diagnosis: 1. Pleural Effusion 2. Hypertension 3. Hyperlipidemia 4. Allergic Rhinitis/ Adult onset Asthma 5. Multinodular thyroid gland    Discharge Medications: Allergies as of 10/14/2020      Reactions   Erythromycin Nausea And Vomiting   Strawberry (diagnostic) Hives   Amoxicillin-pot Clavulanate Nausea And Vomiting, Nausea Only   Lisinopril Cough      Medication List    TAKE these medications   acetaminophen 500 MG tablet Commonly known as: TYLENOL Take 1,000 mg by mouth every 6 (six) hours as needed for headache (pain).   albuterol 108 (90 Base) MCG/ACT inhaler Commonly known as: VENTOLIN HFA Inhale 2 puffs into the lungs every 6 (six) hours as needed for wheezing or shortness of breath.   amLODipine 5 MG tablet Commonly known as: NORVASC Take 5 mg by mouth daily.   famotidine 20 MG tablet Commonly known as: PEPCID Take 1 tablet (20 mg total) by mouth 2 (two) times daily.   fluticasone 50 MCG/ACT nasal spray Commonly known as: FLONASE Place 1 spray into both nostrils daily.   hydrochlorothiazide 25 MG tablet Commonly known as: HYDRODIURIL Take 25 mg by mouth daily.   montelukast 10 MG tablet Commonly known as: SINGULAIR Take 10 mg by mouth at bedtime.   rosuvastatin 5 MG tablet Commonly known as: CRESTOR Take 5 mg by mouth at bedtime.   TUMS PO Take 1 tablet by mouth 2 (two) times daily as needed (acid reflux).       Disposition and follow-up:   [name] was discharged from [facility] in Stable condition.  At the hospital follow up visit please address:  1.  - Pleural Effusion : Please follow up with Dr. Austin Miles on Oct 21, 2020 for further work up. Please set up Mammogram appointment as soon as  possible. - Hypertension : Please continue Amlodipine 10 mg daily & HCTZ 25 mg daily and see PCP if needed. - Hyperlipidemia : Please continue Crestor 5 mg daily and see PCP if needed. - Allergic Rhinitis/ Adult onset Asthma : Continue Albuterol inhaler  As needed. No further work up at this time.  - Multinodular thyroid gland: Not clinically significantper radiology; no follow-up imaging recommended  2.  Labs / imaging needed at time of follow-up: Chest X-ray  3.  Pending labs/ test needing follow-up: EKG  Follow-up Appointments:  Follow-up Information    [facility]. Call.   Specialty: Family Medicine Why: Call firs tthink on Monday to set up primary care services/ MD.  Contact information: [address] [place] [address] [phone]       Merrilyn Puma, MD. Go on 10/21/2020.   Specialty: Internal Medicine Why: @ 9:15 am Contact information: [address] [place] [address] [phone]               Hospital Course by problem list: #Pleural effusion Patient with hx of adult onset asthma presented with cough and SOB and found to have left sided pleural effusion on imaging. Tachycardic and hypertensive on arrival. Exam showed bibasilar rales (L>R), no LE edema or JVD. CBC showed mild leukocytosis and thrombocytosis likely reactive in nature. BMP and troponins normal. EKG shows sinus tach and CXR with evidence of mod-large left-sided pleural effusion. CT chest/angio neg for  PE, but confirms pleural effusion. Had image-guided left thoracentesis, which yielded 1.1 liters of hazy gold fluid. Procedure was stopped after 1.1 liters per patient request secondary to patient's symptoms (chest pain, coughing). Chest x-ray post procedure shows improvement in left pleural effusion. No pneumothorax ost thoracentesis. Echo cardiogram was performed and it reads normal.  considered exudative if it meets 1 of Light's criteria. She meets all  3- a). Pleural fluid protein/ Serum protein 7.2= 0.68 b). Pleural fluid LDH 159/ Serum LDH 133=1.19 c). Pleural fluid LDH> 2/3 Serum LDH.Has a long h/o smoking, parents deceased from lung cancer. At this point it can be possibly malignancy. Chest x-ray & CT scan were performed. Dr. Mosetta Putt from oncology was consulted for any suggested further work up. Patient would be setting up appointment for mammogram soon. She tried getting appointment with a new PCP but due to unavailability she would be following up at [facility] clinic.   #HTN Patient has a hx of uncontrolled hypertension on amlodipine and HCTZ at home. Found to be hypertensive to systolic in 150s. Normal kidney function. BP trended down with Amlodipine 10 mg daily & HCTZ 25 mg daily    #HLD Chronic and stable. Continued on home Crestor 5 mg daily    #Allergic rhinitis #Adult onset Asthma Reports chronic history and mostly controlled. She has symptoms about twice a year. Has albuterol and Flonase at home but only need albuterol about 1-2 times a year.     #Multinodular thyroid gland  Found incidentally on CTA. The largest nodule measuring up to 1.3 cm within the right thyroid lobe. Not clinically significant per radiology; no follow-up imaging recommended  Discharge Vitals:   BP 130/80 (BP Location: Right Arm)   Pulse 99   Temp 98.3 F (36.8 C) (Oral)   Resp 18   Ht 5\' 5"  (1.651 m)   Wt 225 lb 1.4 oz (102.1 kg)   SpO2 93%   BMI 37.46 kg/m   Pertinent Labs, Studies, and Procedures:  CBC Latest Ref Rng & Units 10/13/2020 10/12/2020 07/28/2020  WBC 4.0 - 10.5 K/uL 9.3 11.4(H) 11.5(H)  Hemoglobin 12.0 - 15.0 g/dL 11.4(L) 12.6 13.4  Hematocrit 36 - 46 % 35.4(L) 39.4 41.0  Platelets 150 - 400 K/uL 468(H) 533(H) 359   BMP Latest Ref Rng & Units 10/13/2020 10/13/2020 10/12/2020  Glucose 70 - 99 mg/dL 10/14/2020) 509(T) 267(T)  BUN 8 - 23 mg/dL 14 11 16   Creatinine 0.44 - 1.00 mg/dL 245(Y 0.99  Sodium 135 - 145 mmol/L 135 136 136   Potassium 3.5 - 5.1 mmol/L 3.9 3.5 3.9  Chloride 98 - 111 mmol/L 98 98 97(L)  CO2 22 - 32 mmol/L 27 29 28   Calcium 8.9 - 10.3 mg/dL 9.0 8.9 9.2   CMP Latest Ref Rng & Units 10/13/2020 10/13/2020 10/12/2020  Glucose 70 - 99 mg/dL 10/15/2020) 10/15/2020) 10/14/2020)  BUN 8 - 23 mg/dL 14 11 16   Creatinine 0.44 - 1.00 mg/dL 053(Z 767(H 419(F  Sodium 135 - 145 mmol/L 135 136 136  Potassium 3.5 - 5.1 mmol/L 3.9 3.5 3.9  Chloride 98 - 111 mmol/L 98 98 97(L)  CO2 22 - 32 mmol/L 27 29 28   Calcium 8.9 - 10.3 mg/dL 9.0 8.9 9.2  Total Protein 6.5 - 8.1 g/dL 7.2 - 8.0  Total Bilirubin 0.3 - 1.2 mg/dL 0.7 - 0.4  Alkaline Phos 38 - 126 U/L 110 - 112  AST 15 - 41 U/L 13(L) - 15  ALT 0 - 44 U/L 11 -  13   Chest X-ray on 11/20:  IMPRESSION: New moderate to large left-sided pleural effusion with underlying opacity. No other abnormalities.  CT Angio Chest PE w or wo contrast:  IMPRESSION: 1. No evidence of pulmonary embolism. 2. Large left pleural effusion with atelectasis of the majority of the left lower lobe as well as the inferior segment of the left upper lobe. 3. Ground-glass attenuation within the aerated portion of the left upper lobe and right perihilar region suspicious for edema. 4. Multinodular thyroid gland with largest nodule measuring up to 1.3 cm within the right thyroid lobe. Not clinically significant; no follow-up imaging recommended   Chest X-ray on 11/21  IMPRESSION: Interval improvement in moderate size left pleural effusion likely with associated basilar atelectasis. No pneumothorax post thoracentesis.  US Thoracentesis:  IMPRESSION: Successful ultrasound guided left thoracentesis yielding 1.1 L of pleural fluid. Discharge Instructions: Discharge Instructions    Call MD for:  difficulty breathing, headache or visual disturbances   Complete by: As directed    Call MD for:  extreme fatigue   Complete by: As directed    Call MD for:  persistant dizziness or light-headedness    Complete by: As directed    Call MD for:  redness, tenderness, or signs of infection (pain, swelling, redness, odor or green/yellow discharge around incision site)   Complete by: As directed    Call MD for:  severe uncontrolled pain   Complete by: As directed    Call MD for:  temperature >100.4   Complete by: As directed    Diet general   Complete by: As directed    Discharge instructions   Complete by: As directed    Dear Ms. [place]: You presented with cough and shortness of breath and on imaging there was Left-sided pleural effusion.  Pleura are thin membrane that line lungs and chest cavity. Water was filled outside lungs in pleura.   You underwent a procedure called thoracentesis and 1.1 L of fluid was yielded. Pleural effusion is considered exudative if it meets 1 of Light's criteria. She meets all 3- a). Pleural fluid protein/ Serum protein 7.2= 0.68 b). Pleural fluid LDH 159/ Serum LDH 133=1.19 c). Pleural fluid LDH> 2/3 Serum LDH.  Possible reasons for Exudative fluids are: Autoimmune disease, Infections, Malignancy, Pancreatitis, Pulmonary embolism.  Plan is for you to set up an appointment for Mammogram. As you were not able to get in with PCP, you will following here at [facility] IM clinic on November 29 @ 9:15 am with Dr. Austin Miles.   Hope you feel better soon and have some specific answers!   Increase activity slowly   Complete by: As directed    No wound care   Complete by: As directed       Signed: Arnoldo Lenis, MD 10/15/2020, 1:19 PM   [phone]

## 2020-10-14 NOTE — Progress Notes (Signed)
   Subjective: HD#2 Patient evaluated at bedside this morning. No overnight acute events.  Patient notes that she was able to rest overnight but not sleep much. She notes that she is still a little sore at site of thoracentesis but has been able to get up and walk around. She still has some cough.  Discussed Echo results and fluid analysis results.  First endorsed onset of symptoms the last week of October and progressively worsened over the course of 2-3 days.   Her last mammogram was 2-2.5 years ago. She does have positive family history of breast cancer in her maternal grandmother. However, her sisters have done genetic testing and are not at risk.   Objective:  Vital signs in last 24 hours: Vitals:   10/13/20 1543 10/13/20 2147 10/14/20 0540 10/14/20 0922  BP: 117/73 134/83 112/72 130/80  Pulse: (!) 107 99 91 99  Resp: 16 20 18 18   Temp: 98.1 F (36.7 C) 97.9 F (36.6 C) 98.1 F (36.7 C) 98.3 F (36.8 C)  TempSrc: Oral Oral Oral Oral  SpO2: 95% 94% 93% 93%  Weight:      Height:       General:Well developed, well-appearingelderly womansitting in bed. No acute distress. Head:Billings/NT .RegularRhythm.No murmurs, rubs, or gallops. No LE edema Pulmonary:Lungs CTAB. Normal effort. No wheezing. Moderate rales in Left lung mid, lower region, Mild rales in  Mild  rales in Right Basilar lung . Abdominal:Soft,nontender,nondistended. Normal bowel sounds. Skin:Warm and dry. No obvious rash or lesions. Neuro:AAOx3 Psych:Anxious mood and affect  Assessment/Plan: Ms. CherylJohnsonis a 64 y.o.female withPMHofHTN,HLD, allergic rhinitis and adult onset asthma admitted for pleural effusion.  Principal Problem:   Pleural effusion  #Pleural effusion From light's criteria patient's pleural fluids points towards being exudative in nature. Pleural effusion is considered exudative if it meets 1 of Light's criteria. She meets all 3- a). Pleural fluid protein/  Serum protein 7.2= 0.68 b). Pleural fluid LDH 159/ Serum LDH 133=1.19 c). Pleural fluid LDH> 2/3 Serum LDH.She has a long h/o smoking, parents deceased from lung cancer. At this point it can be possibly malignancy. Chest x-ray & CT scan were performed. Dr. 64 from oncology was consulted for any suggested further work up. Patient would be setting up appointment for mammogram soon. She tried getting appointment with a new PCP but due to unavailability she would be following up at [facility] clinic.    #HTN Patient has a hx of uncontrolled hypertension on amlodipine and HCTZ at home. Found to be hypertensive to systolic in 150s. Normal kidney function. BP trending down. Continue to monitor. --Amlodipine 10 mg daily --HCTZ 25 mg daily --Daily vitals --Consider a combo agent at d/c  #HLD Chronic and stable.  --Continue home Crestor 5 mg daily --F/u lipid panel  #Allergic rhinitis #Adult onset Asthma Reports chronic history and mostly controlled. She has symptoms about twice a year. Has albuterol and Flonase at home but only need albuterol about 1-2 times a year.  --Albuterol inhaler  --Singular 10 mg daily at bedtime --Pulse ox  #Multinodular thyroid gland Found incidentally on CTA. Thelargest nodule measuring up to 1.3 cm within the right thyroid lobe. --Not clinically significantper radiology; no follow-up imaging recommended.  Prior to Admission Living Arrangement: Home Anticipated Discharge Location: Home Barriers to Discharge: None Dispo: Anticipated discharge in approximately 0 day(s).   [facility], MD 10/14/2020, 11:47 AM Pager: [phone] After 5pm on weekdays and 1pm on weekends: On Call pager [phone]

## 2020-10-15 LAB — CYTOLOGY - NON PAP

## 2020-10-15 LAB — PH, BODY FLUID: pH, Body Fluid: 7.6

## 2020-10-15 LAB — SURGICAL PATHOLOGY

## 2020-10-15 LAB — MICROALBUMIN / CREATININE URINE RATIO
Creatinine, Urine: 95.2 mg/dL
Microalb Creat Ratio: 4 mg/g creat (ref 0–29)
Microalb, Ur: 4 ug/mL — ABNORMAL HIGH

## 2020-10-15 NOTE — Telephone Encounter (Signed)
Transition Care Management Follow-up Telephone Call  Date of discharge and from where: 10/14/2020 from [facility]  How have you been since you were released from the hospital? Good  Any questions or concerns? No  Items Reviewed:  Did the pt receive and understand the discharge instructions provided? Yes   Medications obtained and verified? N/A, no new medications prescribed  Other? No   Any new allergies since your discharge? No   Dietary orders reviewed? yes  Do you have support at home? Yes, lives alone, but I have people who can help me if needed.   Home Care and Equipment/Supplies: Were home health services ordered? NO  Functional Questionnaire: (I = Independent and D = Dependent) ADLs: I  Bathing/Dressing- I  Meal Prep- I  Eating- I  Maintaining continence- I  Transferring/Ambulation- I  Managing Meds- I  Follow up appointments reviewed:   PCP Hospital f/u appt confirmed? Yes  Scheduled to see [facility]/Dr. Austin Miles on 10/21/20 @ 0915.  Specialist Hospital f/u appt confirmed? N/A  Are transportation arrangements needed? No  If their condition worsens, is the pt aware to call PCP or go to the Emergency Dept.? Yes  Was the patient provided with contact information for the PCP's office or ED? Yes  Was to pt encouraged to call back with questions or concerns? Yes

## 2020-10-16 LAB — BODY FLUID CULTURE: Culture: NO GROWTH

## 2020-10-21 ENCOUNTER — Encounter: Payer: Self-pay | Admitting: Student

## 2020-10-21 ENCOUNTER — Ambulatory Visit (INDEPENDENT_AMBULATORY_CARE_PROVIDER_SITE_OTHER): Payer: 59 | Admitting: Student

## 2020-10-21 ENCOUNTER — Ambulatory Visit (HOSPITAL_COMMUNITY)
Admission: RE | Admit: 2020-10-21 | Discharge: 2020-10-21 | Disposition: A | Discharge status: Home / Self Care | Payer: 59 | Source: Ambulatory Visit | Attending: Internal Medicine | Admitting: Internal Medicine

## 2020-10-21 ENCOUNTER — Other Ambulatory Visit: Payer: Self-pay

## 2020-10-21 VITALS — BP 134/77 | HR 100 | Temp 97.6°F | Ht 65.0 in | Wt 217.5 lb

## 2020-10-21 DIAGNOSIS — Z0000 Encounter for general adult medical examination without abnormal findings: Secondary | ICD-10-CM

## 2020-10-21 DIAGNOSIS — J90 Pleural effusion, not elsewhere classified: Secondary | ICD-10-CM | POA: Insufficient documentation

## 2020-10-21 DIAGNOSIS — E785 Hyperlipidemia, unspecified: Secondary | ICD-10-CM

## 2020-10-21 DIAGNOSIS — I10 Essential (primary) hypertension: Secondary | ICD-10-CM | POA: Diagnosis not present

## 2020-10-21 DIAGNOSIS — Z1211 Encounter for screening for malignant neoplasm of colon: Secondary | ICD-10-CM | POA: Diagnosis not present

## 2020-10-21 NOTE — Assessment & Plan Note (Signed)
She has been COVID vaccinated x2. Still not due for her booster shot yet, but she is interested in getting it once she can.  She does not want the flu shot today.

## 2020-10-21 NOTE — Progress Notes (Signed)
CC: hospital follow up for pleural effusion  HPI:  [name] is a 64 y.o. female with history of HTN and HLD presenting for hospital follow up of left sided exudative pleural effusion. Please see individualized A&P for full HPI.  Past Medical History:  Diagnosis Date  . Hypertension    Current Outpatient Medications on File Prior to Visit  Medication Sig Dispense Refill  . acetaminophen (TYLENOL) 500 MG tablet Take 1,000 mg by mouth every 6 (six) hours as needed for headache (pain).    Marland Kitchen albuterol (VENTOLIN HFA) 108 (90 Base) MCG/ACT inhaler Inhale 2 puffs into the lungs every 6 (six) hours as needed for wheezing or shortness of breath.    Marland Kitchen amLODipine (NORVASC) 5 MG tablet Take 5 mg by mouth daily.    . Calcium Carbonate Antacid (TUMS PO) Take 1 tablet by mouth 2 (two) times daily as needed (acid reflux).    . famotidine (PEPCID) 20 MG tablet Take 1 tablet (20 mg total) by mouth 2 (two) times daily. (Patient not taking: Reported on 10/12/2020) 30 tablet 2  . fluticasone (FLONASE) 50 MCG/ACT nasal spray Place 1 spray into both nostrils daily.    . hydrochlorothiazide (HYDRODIURIL) 25 MG tablet Take 25 mg by mouth daily.    . montelukast (SINGULAIR) 10 MG tablet Take 10 mg by mouth at bedtime.    . rosuvastatin (CRESTOR) 5 MG tablet Take 5 mg by mouth at bedtime.     No current facility-administered medications on file prior to visit.   Allergies  Allergen Reactions  . Erythromycin Nausea And Vomiting  . Strawberry (Diagnostic) Hives  . Amoxicillin-Pot Clavulanate Nausea And Vomiting and Nausea Only  . Lisinopril Cough   Family History  Problem Relation Age of Onset  . Lung cancer Mother 58  . Lung cancer Father 36   Social History   Socioeconomic History  . Marital status: Widowed    Spouse name: Not on file  . Number of children: Not on file  . Years of education: Not on file  . Highest education level: Not on file  Occupational History  . Not on file   Tobacco Use  . Smoking status: Never Smoker  . Smokeless tobacco: Never Used  Substance and Sexual Activity  . Alcohol use: Never  . Drug use: Not on file  . Sexual activity: Not on file  Other Topics Concern  . Not on file  Social History Narrative  . Not on file   Social Determinants of Health   Financial Resource Strain:   . Difficulty of Paying Living Expenses: Not on file  Food Insecurity:   . Worried About Programme researcher, broadcasting/film/video in the Last Year: Not on file  . Ran Out of Food in the Last Year: Not on file  Transportation Needs:   . Lack of Transportation (Medical): Not on file  . Lack of Transportation (Non-Medical): Not on file  Physical Activity:   . Days of Exercise per Week: Not on file  . Minutes of Exercise per Session: Not on file  Stress:   . Feeling of Stress : Not on file  Social Connections:   . Frequency of Communication with Friends and Family: Not on file  . Frequency of Social Gatherings with Friends and Family: Not on file  . Attends Religious Services: Not on file  . Active Member of Clubs or Organizations: Not on file  . Attends Banker Meetings: Not on file  . Marital Status:  Not on file  Intimate Partner Violence:   . Fear of Current or Ex-Partner: Not on file  . Emotionally Abused: Not on file  . Physically Abused: Not on file  . Sexually Abused: Not on file     Review of Systems:   + cough for [facility]  Otherwise negative ROS.  Physical Exam:  Vitals:   10/21/20 0919  BP: (!) 144/78  Pulse: (!) 120  Temp: 97.6 F (36.4 C)  TempSrc: Oral  SpO2: 96%  Weight: 217 lb 8 oz (98.7 kg)  Height: 5\' 5"  (1.651 m)   Physical Exam Constitutional:      Appearance: She is obese. She is not ill-appearing.  HENT:     Head: Normocephalic and atraumatic.     Mouth/Throat:     Mouth: Mucous membranes are moist.     Pharynx: Oropharynx is clear. No oropharyngeal exudate.  Eyes:     Extraocular Movements: Extraocular movements  intact.     Conjunctiva/sclera: Conjunctivae normal.     Pupils: Pupils are equal, round, and reactive to light.  Cardiovascular:     Rate and Rhythm: Normal rate and regular rhythm.     Pulses: Normal pulses.     Heart sounds: Normal heart sounds. No murmur heard.  No friction rub. No gallop.   Pulmonary:     Effort: Pulmonary effort is normal. No respiratory distress.     Breath sounds: No stridor. No wheezing, rhonchi or rales.     Comments: Decreased breath sounds on left side up to mid-thorax with dullness to percussion in same area. Abdominal:     General: Bowel sounds are normal. There is no distension.     Palpations: Abdomen is soft. There is no mass.     Tenderness: There is no abdominal tenderness.  Musculoskeletal:        General: No swelling. Normal range of motion.     Cervical back: Normal range of motion.  Skin:    General: Skin is warm and dry.     Findings: No rash.  Neurological:     General: No focal deficit present.     Mental Status: She is alert and oriented to person, place, and time.  Psychiatric:        Mood and Affect: Mood normal.        Behavior: Behavior normal.        Thought Content: Thought content normal.      Assessment & Plan:   See Encounters Tab for problem based charting.  Patient seen with Dr. 

## 2020-10-21 NOTE — Assessment & Plan Note (Signed)
Patient with history of HLD, on crestor 5mg . Reports that she was not taking the crestor at all prior to last admission. She has been taking it daily since her hospitalization.  Plan: -continue current medication

## 2020-10-21 NOTE — Assessment & Plan Note (Signed)
Patient with history of HTN, on norvasc 5mg  and HCTZ 25mg . BP today 144/78 with repeat BP 134/77.  Plan: -continue current medications

## 2020-10-21 NOTE — Progress Notes (Signed)
I contacted Tamara Walton and she will contact [facility] and have her records ( PAP ) sent over . Pt will call back to notify us that it was done

## 2020-10-21 NOTE — Assessment & Plan Note (Signed)
Patient found to have moderate left sided exudative pleural effusion during hospital admission, here for follow up. She reports that her symptoms are much improved after the thoracentesis. She still has a cough and some intermittent SHOB, but both are much better after the thoracentesis. She reports that her cough and SHOB have not been worsening. On exam, breath sounds are decreased on left side up to mid-thorax and there is dullness to percussion in same area.  I will obtain a CXR today to reevaluate left sided pleural effusion and assess whether it is improving, worsening, or stable compared to prior. Patient states that she will contact [facility] herself to schedule her screening mammogram. Will send patient home with FIT stool testing kit for colon cancer screening (does not want colonoscopy at this time). She reports that she had a pap smear performed at [facility] 2 years ago, so will obtain records on this.  Plan: -repeat CXR -schedule screening mammogram -FIT stool testing -Obtain pap smear records -f/u in 2 weeks

## 2020-10-21 NOTE — Patient Instructions (Signed)
Ms. Dern,  It was a pleasure seeing you in the clinic today.   We discussed the following today:  1. Please schedule your mammogram with [facility].  2. We are getting a chest x-ray today to reevaluate the fluid around your lungs. I will call you with the results once I have them.  3. Please complete your stool testing kit and return it.  4. Continue taking your blood pressure and cholesterol medications. Also please incorporate a healthier diet and exercise into your daily life.  Please call our clinic at [phone] if you have any questions or concerns. The best time to call is Monday-Friday from 9am-4pm, but there is someone available 24/7 at the same number. If you need medication refills, please notify your pharmacy one week in advance and they will send [place] a request.   Thank you for letting us take part in your care. We look forward to seeing you next time!

## 2020-10-22 ENCOUNTER — Other Ambulatory Visit: Payer: Self-pay | Admitting: Student

## 2020-10-22 ENCOUNTER — Telehealth: Payer: Self-pay | Admitting: Student

## 2020-10-22 DIAGNOSIS — Z1231 Encounter for screening mammogram for malignant neoplasm of breast: Secondary | ICD-10-CM

## 2020-10-22 DIAGNOSIS — J90 Pleural effusion, not elsewhere classified: Secondary | ICD-10-CM

## 2020-10-22 NOTE — Telephone Encounter (Signed)
Spoke with Tamara Walton via telephone call. Verified identity via DOB and home address. Informed Tamara Walton about chest x-ray results showing enlargement of left sided pleural effusion. Informed her that I will be referring her to pulmonology for further workup as to why she is having recurrent exudative pleural effusion. She agrees with this plan.  In the meantime, we are still awaiting stool kit testing for colon cancer screening, screening mammogram for breast cancer screening, and for records of her pap smear results (done 2 years ago) from [place].

## 2020-10-23 ENCOUNTER — Ambulatory Visit
Admission: RE | Admit: 2020-10-23 | Discharge: 2020-10-23 | Disposition: A | Discharge status: Home / Self Care | Payer: 59 | Source: Ambulatory Visit

## 2020-10-23 ENCOUNTER — Other Ambulatory Visit: Payer: Self-pay

## 2020-10-23 DIAGNOSIS — Z1231 Encounter for screening mammogram for malignant neoplasm of breast: Secondary | ICD-10-CM

## 2020-10-23 NOTE — Progress Notes (Signed)
Internal Medicine Clinic Attending  I saw and evaluated the patient.  I personally confirmed the key portions of the history and exam documented by Dr. Austin Miles and I reviewed pertinent patient test results.  The assessment, diagnosis, and plan were formulated together and I agree with the documentation in the resident's note.  Case discussed with Dr. Austin Miles after x ray results showed increasing pleural effusion. He will refer patient to pulm for possible repeat thoracentesis and further work up of recurrent exudative pleural effusion

## 2020-10-25 ENCOUNTER — Other Ambulatory Visit: Payer: Self-pay

## 2020-10-25 ENCOUNTER — Other Ambulatory Visit: Payer: Self-pay | Admitting: Student

## 2020-10-25 DIAGNOSIS — R928 Other abnormal and inconclusive findings on diagnostic imaging of breast: Secondary | ICD-10-CM

## 2020-10-29 ENCOUNTER — Other Ambulatory Visit: Payer: Self-pay

## 2020-10-29 ENCOUNTER — Other Ambulatory Visit: Payer: 59

## 2020-10-29 ENCOUNTER — Telehealth: Payer: Self-pay | Admitting: *Deleted

## 2020-10-29 DIAGNOSIS — Z1211 Encounter for screening for malignant neoplasm of colon: Secondary | ICD-10-CM

## 2020-10-29 NOTE — Telephone Encounter (Signed)
Pt calls and states she didn't hear from anyone about an appt w/ pulmonary so she called and got an appt but it will be 1/6. She is upset and thinks that is too long to wait, she is afraid that her lung is going to fill up again and she doesn't want to be in the hospital and go thru everything she did before. Would you want her seen before 1/6? Do you know how much fluid is in her lung? Do you want to call her back, she is really anxious?

## 2020-10-30 LAB — FECAL OCCULT BLOOD, IMMUNOCHEMICAL: Fecal Occult Bld: NEGATIVE

## 2020-11-01 ENCOUNTER — Encounter: Payer: Self-pay | Admitting: Internal Medicine

## 2020-11-01 ENCOUNTER — Ambulatory Visit (INDEPENDENT_AMBULATORY_CARE_PROVIDER_SITE_OTHER): Payer: 59 | Admitting: Internal Medicine

## 2020-11-01 ENCOUNTER — Other Ambulatory Visit: Payer: Self-pay

## 2020-11-01 VITALS — BP 137/86 | HR 103 | Temp 97.8°F | Ht 65.0 in | Wt 218.6 lb

## 2020-11-01 DIAGNOSIS — E538 Deficiency of other specified B group vitamins: Secondary | ICD-10-CM

## 2020-11-01 DIAGNOSIS — J90 Pleural effusion, not elsewhere classified: Secondary | ICD-10-CM | POA: Diagnosis not present

## 2020-11-01 MED ORDER — CYANOCOBALAMIN 1000 MCG/ML IJ SOLN
1000.0000 ug | Freq: Once | INTRAMUSCULAR | Status: AC
  Administered 2020-11-01: 1000 ug via INTRAMUSCULAR

## 2020-11-01 NOTE — Progress Notes (Signed)
   CC: left pleural effusion   HPI:  [name] is a 64 y.o. with PMH as below.   Please see A&P for assessment of the patient's acute and chronic medical conditions.   Past Medical History:  Diagnosis Date  . Hypertension    Review of Systems:   Review of Systems  Constitutional: Positive for malaise/fatigue. Negative for chills and fever.  Respiratory: Positive for shortness of breath. Negative for cough, hemoptysis and wheezing.   Cardiovascular: Negative for chest pain, palpitations and leg swelling.  Gastrointestinal: Negative for abdominal pain, nausea and vomiting.  Neurological: Negative for dizziness, tingling and weakness.   Physical Exam: Constitution: NAD, appears stated age Cardio: RRR, no m/r/g, no LE edema  Respiratory: Decreased breath sounds left lower lobe, otherwise clear to auscultation  Abdominal: NTTP, soft, non-distended MSK: moving all extremities Neuro: normal affect, a&ox3 Skin: c/d/i   Vitals:   11/01/20 0917 11/01/20 0920  BP: (!) 142/95 137/86  Pulse: (!) 116 (!) 103  Temp: 97.8 F (36.6 C)   TempSrc: Oral   SpO2: 96%   Weight: 218 lb 9.6 oz (99.2 kg)   Height: 5\' 5"  (1.651 m)     Assessment & Plan:   See Encounters Tab for problem based charting.  Patient discussed with Dr. 

## 2020-11-01 NOTE — Patient Instructions (Addendum)
Thank you for allowing [place] to provide your care today. Today we discussed your left pleural effusion.   I have placed a referral to interventional radiology. Someone will contact you to get this done.    Please check to see if there are vitamin B12 chewable tablets over the counter, otherwise we can continue month B12 injections.   Please call the internal medicine center clinic if you have any questions or concerns, we may be able to help and keep you from a long and expensive emergency room wait. Our clinic and after hours phone number is [phone], the best time to call is Monday through Friday 9 am to 4 pm but there is always someone available 24/7 if you have an emergency. If you need medication refills please notify your pharmacy one week in advance and they will send [place] a request.

## 2020-11-02 ENCOUNTER — Ambulatory Visit
Admission: RE | Admit: 2020-11-02 | Discharge: 2020-11-02 | Disposition: A | Discharge status: Home / Self Care | Payer: 59 | Source: Ambulatory Visit | Attending: Internal Medicine | Admitting: Internal Medicine

## 2020-11-02 DIAGNOSIS — R928 Other abnormal and inconclusive findings on diagnostic imaging of breast: Secondary | ICD-10-CM

## 2020-11-02 DIAGNOSIS — E538 Deficiency of other specified B group vitamins: Secondary | ICD-10-CM | POA: Insufficient documentation

## 2020-11-02 NOTE — Assessment & Plan Note (Signed)
Admitted 11/20-11/22 for exudative pleural effusion without a clear source. After discharge she was undergoing preventative healthcare cancer screening. She followed up in clinic 11/29 with recurrence of her pleural effusion on CXR and was referred to pulmonology. She has scheduled an appt for 11/29/19 but feels this is too long to wait as she is more dyspneic on exertion.  On physical exam, her lung sounds are similar to what is seen on the 11/29 cxr, clear in the superior half of the lung. Ambulated in the hallway and maintained O2 sats >95% on room air with HR to the 120s, this decreased to 100 at rest. She also had a mammogram 12/01 concerning for a mass in the left breast and will have diagnostic mammogram and [place] on 12/11. FIT test was negative.   - discussed with pulm and were able to move her appointment to 12/20 - discussed with IR and order placed for thoracentesis to be done sooner as she is symptomatic - f/u in one month

## 2020-11-02 NOTE — Assessment & Plan Note (Signed)
B12 low. She has difficulty swallowing pills. We will do B12 injection today. She will see if she can find chewable B12 vitamins otherwise we will continue with monthly B12 injection.   - B12 injection today  - discuss at f/u in one month  - obtain MMA at follow-up

## 2020-11-03 NOTE — Telephone Encounter (Signed)
Hey, the imaging from my last visit showed moderate sized pleural effusion in her lungs. If she is not having shortness of breath or any other symptoms then it should be fine for her to wait, but if she is still worried and wants to be seen again then you can have her come in for another visit.

## 2020-11-04 NOTE — Progress Notes (Signed)
Internal Medicine Clinic Attending  Case discussed with Dr. Cleaster Corin at the time of the visit.  We reviewed the resident's history and exam and pertinent patient test results.  I agree with the assessment, diagnosis, and plan of care documented in the resident's note.  Recent finding of breast mass is very concerning; f/u US on Saturday.  Likely to need thoracentesis for symptom management prior to her scheduled visit with pulmonologist, agree with IR (ED if symptoms worsen before this can be arranged outpatient).

## 2020-11-05 ENCOUNTER — Other Ambulatory Visit (HOSPITAL_COMMUNITY)
Admission: RE | Admit: 2020-11-05 | Discharge: 2020-11-05 | Disposition: A | Discharge status: Home / Self Care | Payer: 59 | Source: Ambulatory Visit | Attending: Internal Medicine | Admitting: Internal Medicine

## 2020-11-05 DIAGNOSIS — Z01812 Encounter for preprocedural laboratory examination: Secondary | ICD-10-CM | POA: Diagnosis present

## 2020-11-05 DIAGNOSIS — Z20822 Contact with and (suspected) exposure to covid-19: Secondary | ICD-10-CM | POA: Insufficient documentation

## 2020-11-05 LAB — SARS CORONAVIRUS 2 (TAT 6-24 HRS): SARS Coronavirus 2: NEGATIVE

## 2020-11-08 ENCOUNTER — Ambulatory Visit (HOSPITAL_COMMUNITY)
Admission: RE | Admit: 2020-11-08 | Discharge: 2020-11-08 | Disposition: A | Discharge status: Home / Self Care | Payer: 59 | Source: Ambulatory Visit | Attending: Internal Medicine | Admitting: Internal Medicine

## 2020-11-08 ENCOUNTER — Other Ambulatory Visit: Payer: Self-pay

## 2020-11-08 ENCOUNTER — Ambulatory Visit (HOSPITAL_COMMUNITY)
Admission: RE | Admit: 2020-11-08 | Discharge: 2020-11-08 | Disposition: A | Discharge status: Home / Self Care | Payer: 59 | Source: Ambulatory Visit | Attending: Radiology | Admitting: Radiology

## 2020-11-08 DIAGNOSIS — Z9889 Other specified postprocedural states: Secondary | ICD-10-CM

## 2020-11-08 DIAGNOSIS — J90 Pleural effusion, not elsewhere classified: Secondary | ICD-10-CM | POA: Diagnosis present

## 2020-11-08 MED ORDER — LIDOCAINE HCL 1 % IJ SOLN
INTRAMUSCULAR | Status: AC
  Filled 2020-11-08: qty 20

## 2020-11-08 NOTE — Procedures (Signed)
Ultrasound-guided therapeutic left thoracentesis performed yielding 800 cc of yellow fluid. No immediate complications. Follow-up chest x-ray pending.Due to pt coughing/chest discomfort only the above amount of fluid was removed today. EBL< 2 cc.      

## 2020-11-12 ENCOUNTER — Encounter: Payer: Self-pay | Admitting: Pulmonary Disease

## 2020-11-12 ENCOUNTER — Other Ambulatory Visit: Payer: Self-pay

## 2020-11-12 ENCOUNTER — Ambulatory Visit: Payer: 59 | Admitting: Pulmonary Disease

## 2020-11-12 ENCOUNTER — Ambulatory Visit (INDEPENDENT_AMBULATORY_CARE_PROVIDER_SITE_OTHER): Payer: 59

## 2020-11-12 VITALS — BP 138/76 | HR 100 | Temp 97.4°F | Ht 65.0 in | Wt 215.2 lb

## 2020-11-12 DIAGNOSIS — J90 Pleural effusion, not elsewhere classified: Secondary | ICD-10-CM | POA: Diagnosis not present

## 2020-11-12 LAB — FUNGUS CULTURE WITH STAIN

## 2020-11-12 LAB — FUNGAL ORGANISM REFLEX

## 2020-11-12 LAB — FUNGUS CULTURE RESULT

## 2020-11-12 MED ORDER — LEVOFLOXACIN 750 MG PO TABS: 750.0000 mg | ORAL_TABLET | Freq: Every day | ORAL | 0 refills | Status: DC

## 2020-11-12 NOTE — Progress Notes (Signed)
Tamara Walton    [id number]    1956/02/02  Primary Care Physician:Jinwala, Marlyce Huge, MD  Referring Physician: Merrilyn Puma, MD [address] [place],  [place] [address]  Chief complaint: Consult for left pleural effusion  HPI: 64 year old with history of hypertension, asthma, hyperlipidemia, allergies Complains of cough with dyspnea for about 8 weeks She was admitted on end of November with lung imaging showing moderate left effusion status post thoracentesis which showed exudative effusion with atypical mesothelial cells.  Negative cultures Noted to have recurrence of effusion and sent for repeat thoracentesis on December 17 by IR.  Unfortunately she did not have repeat pleural fluid studies performed.  Overall she feels that the cough has improved but still has some mild dyspnea on exertion Recently had an evaluation for breast cyst with a mammogram which showed benign findings.  She had a stool test for colon cancer which is negative and has a Pap smear pending next month.  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  Quit in 2008 Travel history: No significant travel history Relevant family history: Mother died of lung cancer.  She was a smoker.  Outpatient Encounter Medications as of 11/12/2020  Medication Sig  . acetaminophen (TYLENOL) 500 MG tablet Take 1,000 mg by mouth every 6 (six) hours as needed for headache (pain).  Marland Kitchen albuterol (VENTOLIN HFA) 108 (90 Base) MCG/ACT inhaler Inhale 2 puffs into the lungs every 6 (six) hours as needed for wheezing or shortness of breath.  Marland Kitchen amLODipine (NORVASC) 5 MG tablet Take 5 mg by mouth daily.  . Calcium Carbonate Antacid (TUMS PO) Take 1 tablet by mouth 2 (two) times daily as needed (acid reflux).  . fluticasone (FLONASE) 50 MCG/ACT nasal spray Place 1 spray into both nostrils daily.  .  hydrochlorothiazide (HYDRODIURIL) 25 MG tablet Take 25 mg by mouth daily.  . montelukast (SINGULAIR) 10 MG tablet Take 10 mg by mouth at bedtime.  . rosuvastatin (CRESTOR) 5 MG tablet Take 5 mg by mouth at bedtime.  . famotidine (PEPCID) 20 MG tablet Take 1 tablet (20 mg total) by mouth 2 (two) times daily. (Patient not taking: No sig reported)   No facility-administered encounter medications on file as of 11/12/2020.    Allergies as of 11/12/2020 - Review Complete 11/12/2020  Allergen Reaction Noted  . Erythromycin Nausea And Vomiting 07/21/2012  . Strawberry (diagnostic) Hives 10/12/2020  . Amoxicillin-pot clavulanate Nausea And Vomiting and Nausea Only 09/29/2013  . Lisinopril Cough 09/27/2018    Past Medical History:  Diagnosis Date  . Hypertension     No past surgical history on file.  Family History  Problem Relation Age of Onset  . Lung cancer Mother 71  . Lung cancer Father 20    Social History   Socioeconomic History  . Marital status: Widowed    Spouse name: Not on file  . Number of children: Not on file  . Years of education: Not on file  . Highest education level: Not on file  Occupational History  . Not on file  Tobacco Use  . Smoking status: Former Games developer  . Smokeless tobacco: Never Used  Substance and Sexual Activity  . Alcohol use: Never  . Drug use: Not on file  . Sexual activity: Not on file  Other Topics Concern  . Not on file  Social History Narrative  .  Not on file   Social Determinants of Health   Financial Resource Strain: Not on file  Food Insecurity: Not on file  Transportation Needs: Not on file  Physical Activity: Not on file  Stress: Not on file  Social Connections: Not on file  Intimate Partner Violence: Not on file    Review of systems: Review of Systems  Constitutional: Negative for fever and chills.  HENT: Negative.   Eyes: Negative for blurred vision.  Respiratory: as per HPI  Cardiovascular: Negative for chest pain  and palpitations.  Gastrointestinal: Negative for vomiting, diarrhea, blood per rectum. Genitourinary: Negative for dysuria, urgency, frequency and hematuria.  Musculoskeletal: Negative for myalgias, back pain and joint pain.  Skin: Negative for itching and rash.  Neurological: Negative for dizziness, tremors, focal weakness, seizures and loss of consciousness.  Endo/Heme/Allergies: Negative for environmental allergies.  Psychiatric/Behavioral: Negative for depression, suicidal ideas and hallucinations.  All other systems reviewed and are negative.  Physical Exam: Blood pressure 138/76, pulse 100, temperature (!) 97.4 F (36.3 C), temperature source Skin, height 5\' 5"  (1.651 m), weight 215 lb 3.2 oz (97.6 kg), SpO2 97 %. Gen:      No acute distress HEENT:  EOMI, sclera anicteric Neck:     No masses; no thyromegaly Lungs:    Diminished breath sounds at the left base CV:         Regular rate and rhythm; no murmurs Abd:      + bowel sounds; soft, non-tender; no palpable masses, no distension Ext:    No edema; adequate peripheral perfusion Skin:      Warm and dry; no rash Neuro: alert and oriented x 3 Psych: normal mood and affect  Data Reviewed: Imaging: CTA 10/12/2020-no PE, left effusion with atelectasis, left upper lobe consolidation, multinodular thyroid gland. Chest x-ray 11/29-moderate left effusion Chest x-ray 12/17-left mid and lower lung consolidation I have reviewed the images personally.  PFTs:  Labs: Pleural studies 10/13/2020- Cell count 3740, 47% lymphs, 32% neutrophil, 21% monocyte macrophage Microbiology-negative Cytology atypical mesothelial cells Pleural fluid protein/ Serum protein 7.2= 0.68 Pleural fluid LDH 159/ Serum LDH 133=1.19  Pleural fluid LDH> 2/3 Serum LDH  Assessment:  Recurrent left effusion Differential diagnosis includes pneumonia, aspiration.  She does have history of choking on pills and CT scan shows some consolidation with bronchograms in  the left upper lobe on my review. Malignancy is also on the differential given smoking history  Unfortunately repeat pleural studies were not done on the second thoracentesis.  I am not sure if the persistent abnormality on postthoracentesis chest x-ray is fluid or consolidated lung Get chest x-ray today Empiric treatment with antibiotics for a week with follow-up CT scan.  Based on review she will need either a bronchoscope or repeat thoracentesis for evaluation She may ultimately end up needing a PET scan as well.  Plan discussed in detail with patient and her sister in office today.  Plan/Recommendations: - Levofloxacin - Chest x-ray - Follow-up CT in 2 weeks - Return to clinic after CT scan.  10/15/2020 MD [facility] 11/12/2020, 10:18 AM  CC: 11/14/2020, MD

## 2020-11-12 NOTE — Patient Instructions (Addendum)
Will prescribe we will prescribe antibiotics Augmentin 875 mg twice daily in liquid form for 14 days Chest x-ray today CT chest without contrast in 2 weeks after completing antibiotics Follow-up in clinic in 2 to 4 weeks for review and plan for next steps.

## 2020-11-18 ENCOUNTER — Telehealth: Payer: Self-pay | Admitting: Pulmonary Disease

## 2020-11-18 MED ORDER — AMOXICILLIN-POT CLAVULANATE 875-125 MG PO TABS: 1.0000 | ORAL_TABLET | Freq: Two times a day (BID) | ORAL | 0 refills | Status: DC

## 2020-11-18 NOTE — Telephone Encounter (Signed)
Spoke with the pt  She was seen 11/12/20 and prescribed levaquin 750 mg x 7 days  She states she was only able to take medication for 2 days bc it causes joint pain- knees  She states that her pharmacist told her if she had any joint pain to d/c med so she has stopped  She says that Dr Isaiah Serge originally wanted her to take augmentin, and she declined due to hx of mild GI upset on augmentin  She states that she is willing to try the augmentin again and that she does not mind dealing with the GI upset  Arlys John, please advise thanks

## 2020-11-18 NOTE — Telephone Encounter (Signed)
11/18/2020  Patient needs to be aware that stopping antibiotics incompletely can put her at high risk for developing multidrug-resistant organisms.  It is not uncommon that antibiotics can cause mild GI upset.  She needs to make sure she is taking this with a full meal preferably higher in protein and fiber.  This can help offset some of the GI upset.  Okay to prescribe:  Augmentin >>> Take 1 875-125 mg tablet every 12 hours for the next 10 days >>> Take with food  Please send in to pharmacy of choice.  If the patient requires additional changes to her antibiotics or she stops the Augmentin prematurely.  She will require a office visit to further evaluate and discuss.  Elisha Headland, FNP

## 2020-11-18 NOTE — Telephone Encounter (Signed)
I spoke with the pt and notified of response per Tamara Walton  She verbalized understanding  I have sent in augmentin  Nothing further needed

## 2020-11-19 ENCOUNTER — Other Ambulatory Visit: Payer: Self-pay

## 2020-11-19 ENCOUNTER — Telehealth: Payer: Self-pay | Admitting: Emergency Medicine

## 2020-11-19 ENCOUNTER — Ambulatory Visit (INDEPENDENT_AMBULATORY_CARE_PROVIDER_SITE_OTHER)
Admission: RE | Admit: 2020-11-19 | Discharge: 2020-11-19 | Disposition: A | Discharge status: Home / Self Care | Payer: 59 | Source: Ambulatory Visit | Attending: Pulmonary Disease | Admitting: Pulmonary Disease

## 2020-11-19 DIAGNOSIS — J90 Pleural effusion, not elsewhere classified: Secondary | ICD-10-CM

## 2020-11-19 NOTE — Telephone Encounter (Signed)
Pt dropped of FMLA paperwork and filled out LBPu paperwork. Informed about time frame and payment. Placed on Patrice's desk for processing.

## 2020-11-26 ENCOUNTER — Telehealth: Payer: Self-pay | Admitting: Emergency Medicine

## 2020-11-26 NOTE — Telephone Encounter (Signed)
Patient calling about CT results done on 11/19/20 let patient know we would send message to Dr. Isaiah Serge and once we heard back we would share his recommendations with patient.  Dr. Isaiah Serge please advise

## 2020-11-27 NOTE — Telephone Encounter (Signed)
Pt following up w/ results. Advised that Dr. Isaiah Serge hasn't had a chance to look at it yet.

## 2020-11-27 NOTE — Telephone Encounter (Signed)
Dr. Isaiah Serge please advise on CT results. Thanks!

## 2020-11-28 ENCOUNTER — Institutional Professional Consult (permissible substitution): Payer: 59 | Admitting: Emergency Medicine

## 2020-11-28 NOTE — Telephone Encounter (Signed)
Called and spoke with Patient.  Scheduled Patient for 11/29/20 at 0825 for pre procedure covid testing.  Patient was made aware of thoracentesis scheduled for 12/03/20 at 2pm at [facility]. Nothing further at this time.

## 2020-11-28 NOTE — Telephone Encounter (Signed)
I called and discussed with the patient. She is feeling better but still has some fluid left behind  I have scheduled her for a thoracentesis on 12/03/2020 at 2 pm [facility] to be be done by me.  Please get her scheduled for COVID testing prior to the procedure. Thanks  Chilton Greathouse MD [facility] Please see [email or web address] for pager details.  11/28/2020, 2:47 PM

## 2020-11-28 NOTE — Telephone Encounter (Signed)
Rec'd FMLA form - prepared paperwork for Dr. Isaiah Serge, will have him sign. -pr

## 2020-11-29 ENCOUNTER — Other Ambulatory Visit (HOSPITAL_COMMUNITY)
Admission: RE | Admit: 2020-11-29 | Discharge: 2020-11-29 | Disposition: A | Discharge status: Home / Self Care | Payer: 59 | Source: Ambulatory Visit | Attending: Pulmonary Disease | Admitting: Pulmonary Disease

## 2020-11-29 DIAGNOSIS — Z20822 Contact with and (suspected) exposure to covid-19: Secondary | ICD-10-CM | POA: Diagnosis not present

## 2020-11-29 DIAGNOSIS — Z01812 Encounter for preprocedural laboratory examination: Secondary | ICD-10-CM | POA: Diagnosis not present

## 2020-11-29 LAB — SARS CORONAVIRUS 2 (TAT 6-24 HRS): SARS Coronavirus 2: NEGATIVE

## 2020-12-02 ENCOUNTER — Telehealth: Payer: Self-pay | Admitting: Pulmonary Disease

## 2020-12-02 NOTE — Telephone Encounter (Signed)
Left message on number listed

## 2020-12-02 NOTE — Telephone Encounter (Signed)
Rec'd forms back from Dr. Isaiah Serge - sent email to Marisue Ivan to drop $29 charge. -pr

## 2020-12-03 ENCOUNTER — Ambulatory Visit (HOSPITAL_COMMUNITY)
Admission: RE | Admit: 2020-12-03 | Discharge: 2020-12-03 | Disposition: A | Discharge status: Home / Self Care | Payer: 59 | Source: Ambulatory Visit | Attending: Pulmonary Disease | Admitting: Pulmonary Disease

## 2020-12-03 ENCOUNTER — Ambulatory Visit (HOSPITAL_COMMUNITY): Payer: 59

## 2020-12-03 ENCOUNTER — Encounter (HOSPITAL_COMMUNITY): Payer: Self-pay | Admitting: Pulmonary Disease

## 2020-12-03 ENCOUNTER — Other Ambulatory Visit: Payer: Self-pay

## 2020-12-03 ENCOUNTER — Encounter (HOSPITAL_COMMUNITY)
Admission: RE | Disposition: A | Discharge status: Home / Self Care | Payer: Self-pay | Source: Ambulatory Visit | Attending: Pulmonary Disease

## 2020-12-03 ENCOUNTER — Other Ambulatory Visit: Payer: Self-pay | Admitting: *Deleted

## 2020-12-03 DIAGNOSIS — J90 Pleural effusion, not elsewhere classified: Secondary | ICD-10-CM | POA: Diagnosis not present

## 2020-12-03 DIAGNOSIS — Z9889 Other specified postprocedural states: Secondary | ICD-10-CM

## 2020-12-03 DIAGNOSIS — J939 Pneumothorax, unspecified: Secondary | ICD-10-CM

## 2020-12-03 HISTORY — PX: THORACENTESIS: SHX235

## 2020-12-03 LAB — BODY FLUID CELL COUNT WITH DIFFERENTIAL
Eos, Fluid: 3 %
Lymphs, Fluid: 75 %
Monocyte-Macrophage-Serous Fluid: 20 % — ABNORMAL LOW (ref 50–90)
Neutrophil Count, Fluid: 1 % (ref 0–25)
Other Cells, Fluid: 1 %
Total Nucleated Cell Count, Fluid: 3668 cu mm — ABNORMAL HIGH (ref 0–1000)

## 2020-12-03 SURGERY — THORACENTESIS
Anesthesia: LOCAL

## 2020-12-03 NOTE — Discharge Instructions (Signed)
Pneumothorax A pneumothorax is commonly called a collapsed lung. It is a condition in which air leaks from a lung and builds up between the thin layer of tissue that covers the lungs (visceral pleura) and the interior wall of the chest cavity (parietal pleura). The air gets trapped outside the lung, between the lung and the chest wall (pleural space). The air takes up space and prevents the lung from fully expanding. This condition sometimes occurs suddenly with no apparent cause. The buildup of air may be small or large. A small pneumothorax may go away on its own. A large pneumothorax will require treatment and hospitalization. What are the causes? This condition may be caused by:  Trauma and injury to the chest wall.  Surgery and other medical procedures.  A complication of an underlying lung problem, especially chronic obstructive pulmonary disease (COPD) or emphysema. Sometimes the cause of this condition is not known. What increases the risk? You are more likely to develop this condition if:  You have an underlying lung problem.  You smoke.  You are 39-64 years old, female, tall, and underweight.  You have a personal or family history of pneumothorax.  You have an eating disorder (anorexia nervosa). This condition can also happen quickly, even in people with no history of lung problems. What are the signs or symptoms? Sometimes a pneumothorax will have no symptoms. When symptoms are present, they can include:  Chest pain.  Shortness of breath.  Increased rate of breathing.  Bluish color to your lips or skin (cyanosis). How is this diagnosed? This condition may be diagnosed by:  A medical history and physical exam.  A chest X-ray, chest CT scan, or ultrasound. How is this treated? Treatment depends on how severe your condition is. The goal of treatment is to remove the extra air and allow your lung to expand back to its normal size.  For a small pneumothorax: ? No  treatment may be needed. ? Extra oxygen is sometimes used to make it go away more quickly.  For a large pneumothorax or one that is causing symptoms, a procedure is done to release the air from around your lungs. To do this, a health care provider may use: ? A needle with a syringe. This is used to suck air from a pleural space where no additional leakage is taking place. ? A chest tube. This is used to suck air where there is ongoing leakage into the pleural space. The chest tube may need to remain in place for several days until the air leak has healed.  In more severe cases, surgery may be needed to repair the damage that is causing the leak.  If you have multiple pneumothorax episodes or have an air leak that will not heal, a procedure called a pleurodesis may be done. A medicine is placed in the pleural space to irritate the tissues around the lung so that the lung will stick to the chest wall, seal any leaks, and stop any buildup of air in that space. If you have an underlying lung problem, severe symptoms, or a large pneumothorax you will usually need to stay in the hospital.   Follow these instructions at home: Lifestyle  Do not use any products that contain nicotine or tobacco, such as cigarettes and e-cigarettes. These are major risk factors in pneumothorax. If you need help quitting, ask your health care provider.  Do not lift anything that is heavier than 10 lb (4.5 kg), or the limit that your  health care provider tells you, until he or she says that it is safe.  Avoid activities that take a lot of effort (strenuous) for as long as told by your health care provider.  Return to your normal activities as told by your health care provider. Ask your health care provider what activities are safe for you.  Do not fly in an airplane or scuba dive until your health care provider says it is okay. General instructions  Take over-the-counter and prescription medicines only as told by your  health care provider.  If a cough or pain makes it difficult for you to sleep at night, try sleeping in a semi-upright position in a recliner or by using 2 or 3 pillows.  If you had a chest tube and it was removed, ask your health care provider when you can remove the bandage (dressing). While the dressing is in place, do not allow it to get wet.  Keep all follow-up visits as told by your health care provider. This is important. Contact a health care provider if:  You cough up thick mucus (sputum) that is yellow or green in color.  You were treated with a chest tube, and you have redness, increasing pain, or discharge at the site where it was placed. Get help right away if:  You have increasing chest pain or shortness of breath.  You have a cough that will not go away.  You begin coughing up blood.  You have pain that is getting worse or is not controlled with medicines.  The site where your chest tube was located opens up.  You feel air coming out of the site where the chest tube was placed.  You have a fever or persistent symptoms for more than 2-3 days. These symptoms may represent a serious problem that is an emergency. Do not wait to see if the symptoms will go away. Get medical help right away. Call your local emergency services (911 in the [place] Do not drive yourself to the hospital. Summary  A pneumothorax, commonly called a collapsed lung, is a condition in which air leaks from a lung and gets trapped between the lung and the chest wall (pleural space).  The buildup of air may be small or large. A small pneumothorax may go away on its own. A large pneumothorax will require treatment and hospitalization.  Treatment for this condition depends on how severe the pneumothorax is. The goal of treatment is to remove the extra air and allow the lung to expand back to its normal size. This information is not intended to replace advice given to you by your health care provider.  Make sure you discuss any questions you have with your health care provider. Document Revised: 07/11/2020 Document Reviewed: 07/11/2020 Elsevier Patient Education  2021 [employer] Thoracentesis, Care After This sheet gives you information about how to care for yourself after your procedure. Your health care provider may also give you more specific instructions. If you have problems or questions, contact your health care provider. What can I expect after the procedure? After your procedure, it is common to have some pain at the site where the needle was inserted (puncture site). Follow these instructions at home: Care of the puncture site  Follow instructions from your health care provider about how to take care of your puncture site. Make sure you: ? Wash your hands with soap and water before you change your bandage (dressing). If soap and water are not available, use hand sanitizer. ?  Change your dressing as told by your health care provider.  Check the puncture site every day for signs of infection. Check for: ? Redness, swelling, or pain. ? Fluid or blood. ? Warmth. ? Pus or a bad smell.  Do not take baths, swim, or use a hot tub until your health care provider approves. General instructions  Take over-the-counter and prescription medicines only as told by your health care provider.  Do not drive for 24 hours if you were given a medicine to help you relax (sedative) during your procedure.  Drink enough fluid to keep your urine pale yellow.  You may return to your normal diet and normal activities as told by your health care provider.  Keep all follow-up visits as told by your health care provider. This is important.   Contact a health care provider if you:  Have redness, swelling, or pain at your puncture site.  Have fluid or blood coming from your puncture site.  Notice that your puncture site feels warm to the touch.  Have pus or a bad smell coming from your puncture  site.  Have a fever.  Have chills.  Have nausea or vomiting.  Have trouble breathing.  Develop a worsening cough. Get help right away if you:  Have extreme shortness of breath.  Develop chest pain.  Faint or feel light-headed. Summary  After your procedure, it is common to have some pain at the site where the needle was inserted (puncture site).  Wash your hands with soap and water before you change your bandage (dressing).  Check your puncture site every day for signs of infection.  Take over-the-counter and prescription medicines only as told by your health care provider. This information is not intended to replace advice given to you by your health care provider. Make sure you discuss any questions you have with your health care provider. Document Revised: 07/11/2020 Document Reviewed: 07/11/2020 Elsevier Patient Education  2021 [employer].

## 2020-12-03 NOTE — Telephone Encounter (Signed)
[facility] has called back and I have given them the code

## 2020-12-03 NOTE — Progress Notes (Signed)
Chest x-ray post thoracentesis shows a small pneumothorax Suspect there was air entry when the catheter was being removed after thoracentesis  She is stable with 99% sats on room air, no respiratory distress Follow-up chest x-ray in 1 hour showed stable tiny pneumothorax  Patient cleared to go home. We will get a follow up chest x ray tomorrow in clinic  Chilton Greathouse MD [facility] Please see [email or web address] for pager details.  12/03/2020, 4:16 PM

## 2020-12-03 NOTE — Procedures (Signed)
Thoracentesis Procedure Note  Pre-operative Diagnosis: Left pleural effusion  Post-operative Diagnosis: normal   Procedure Details  Consent: Informed consent was obtained. Risks of the procedure were discussed including: infection, bleeding, pain, pneumothorax.  Under sterile conditions the patient was positioned. Betadine solution and sterile drapes were utilized.  1% buffered lidocaine was used to anesthetize the posterior rib space. Fluid was obtained without any difficulties and minimal blood loss.  A dressing was applied to the wound and wound care instructions were provided.   Findings 500 ml of straw colored pleural fluid was obtained. A sample was sent to Pathology for cytogenetics, flow, and cell counts, as well as for infection analysis.  Complications:  None; patient tolerated the procedure well.          Condition: stable  Plan A follow up chest x-ray was ordered.  Chilton Greathouse MD [facility] Please see [email or web address] for pager details.  12/03/2020, 2:41 PM

## 2020-12-03 NOTE — H&P (Signed)
Tamara Walton    [id number]    [birth year]  Primary Care Physician:Jinwala, Marlyce Huge, MD  Referring Physician: No referring provider defined for this encounter.  Chief complaint: Follow up for left pleural effusion  HPI: 65 year old with history of hypertension, asthma, hyperlipidemia, allergies Complains of cough with dyspnea for about 8 weeks She was admitted on end of November with lung imaging showing moderate left effusion status post thoracentesis which showed exudative effusion with atypical mesothelial cells.  Negative cultures Noted to have recurrence of effusion and sent for repeat thoracentesis on December 17 by IR.  Unfortunately she did not have repeat pleural fluid studies performed.  Overall she feels that the cough has improved but still has some mild dyspnea on exertion Recently had an evaluation for breast cyst with a mammogram which showed benign findings.  She had a stool test for colon cancer which is negative and has a Pap smear pending next month.  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  Quit in 2008 Travel history: No significant travel history Relevant family history: Mother died of lung cancer.  She was a smoker.  Outpatient Encounter Medications as of 11/12/2020  Medication Sig  . acetaminophen (TYLENOL) 500 MG tablet Take 1,000 mg by mouth every 6 (six) hours as needed for headache (pain).  Marland Kitchen albuterol (VENTOLIN HFA) 108 (90 Base) MCG/ACT inhaler Inhale 2 puffs into the lungs every 6 (six) hours as needed for wheezing or shortness of breath.  Marland Kitchen amLODipine (NORVASC) 5 MG tablet Take 5 mg by mouth daily.  . Calcium Carbonate Antacid (TUMS PO) Take 1 tablet by mouth 2 (two) times daily as needed (acid reflux).  . fluticasone (FLONASE) 50 MCG/ACT nasal spray Place 1 spray into both nostrils daily.  .  hydrochlorothiazide (HYDRODIURIL) 25 MG tablet Take 25 mg by mouth daily.  . montelukast (SINGULAIR) 10 MG tablet Take 10 mg by mouth at bedtime.  . rosuvastatin (CRESTOR) 5 MG tablet Take 5 mg by mouth at bedtime.  . famotidine (PEPCID) 20 MG tablet Take 1 tablet (20 mg total) by mouth 2 (two) times daily. (Patient not taking: No sig reported)   No facility-administered encounter medications on file as of 11/12/2020.   Physical Exam: Blood pressure 122/76, pulse (!) 104, temperature 97.7 F (36.5 C), resp. rate 16, height 5\' 5"  (1.651 m), weight 98.4 kg, SpO2 96 %. Gen:      No acute distress HEENT:  EOMI, sclera anicteric Neck:     No masses; no thyromegaly Lungs:    Clear to auscultation bilaterally; normal respiratory effort CV:         Regular rate and rhythm; no murmurs Abd:      + bowel sounds; soft, non-tender; no palpable masses, no distension Ext:    No edema; adequate peripheral perfusion Skin:      Warm and dry; no rash Neuro: alert and oriented x 3 Psych: normal mood and affect  Data Reviewed: Imaging: CTA 10/12/2020-no PE, left effusion with atelectasis, left upper lobe consolidation, multinodular thyroid gland. Chest x-ray 11/29-moderate left effusion Chest x-ray 12/17-left mid and lower lung consolidation I have reviewed the images personally.  PFTs:  Labs: Pleural studies 10/13/2020- Cell count 3740, 47% lymphs, 32% neutrophil, 21% monocyte macrophage Microbiology-negative Cytology atypical mesothelial cells Pleural fluid protein/ Serum protein 7.2= 0.68 Pleural fluid LDH 159/ Serum LDH  133=1.19  Pleural fluid LDH> 2/3 Serum LDH  Assessment:  Recurrent left effusion Differential diagnosis includes pneumonia, aspiration.  She does have history of choking on pills and CT scan shows some consolidation with bronchograms in the left upper lobe on my review. Malignancy is also on the differential given smoking history  Unfortunately repeat pleural studies were  not done on the second thoracentesis.  CT show reduced effusion but moderate amount remains. Proceed with thoracentesis today.  Plan/Recommendations: - Thoracentesis  Chilton Greathouse MD [facility] 12/03/2020, 2:42 PM  CC: No ref. provider found

## 2020-12-04 ENCOUNTER — Other Ambulatory Visit: Payer: Self-pay | Admitting: *Deleted

## 2020-12-04 ENCOUNTER — Encounter: Payer: Self-pay | Admitting: Pulmonary Disease

## 2020-12-04 ENCOUNTER — Ambulatory Visit (INDEPENDENT_AMBULATORY_CARE_PROVIDER_SITE_OTHER): Payer: 59

## 2020-12-04 ENCOUNTER — Encounter (HOSPITAL_COMMUNITY): Payer: Self-pay | Admitting: Pulmonary Disease

## 2020-12-04 DIAGNOSIS — J939 Pneumothorax, unspecified: Secondary | ICD-10-CM

## 2020-12-04 NOTE — Progress Notes (Signed)
Follow-up chest x-ray 1/12 reviewed with trace left apical pneumothorax.  Patient remains clinically stable  Order repeat chest x-ray in 1 week.

## 2020-12-05 LAB — PH, BODY FLUID: pH, Body Fluid: 7.8

## 2020-12-06 DIAGNOSIS — Z0289 Encounter for other administrative examinations: Secondary | ICD-10-CM

## 2020-12-06 LAB — CYTOLOGY - NON PAP

## 2020-12-06 NOTE — Telephone Encounter (Signed)
Called and collected $29 via phone - Faxed forms to [place] at [phone] -pr

## 2020-12-08 LAB — BODY FLUID CULTURE
Culture: NO GROWTH
Special Requests: NORMAL

## 2020-12-11 ENCOUNTER — Ambulatory Visit (INDEPENDENT_AMBULATORY_CARE_PROVIDER_SITE_OTHER): Payer: 59

## 2020-12-11 DIAGNOSIS — J939 Pneumothorax, unspecified: Secondary | ICD-10-CM | POA: Diagnosis not present

## 2020-12-13 ENCOUNTER — Other Ambulatory Visit: Payer: 59

## 2020-12-13 NOTE — Progress Notes (Signed)
Called and left message on voicemail to please return phone call to go over results. Contact number provided. 

## 2020-12-16 ENCOUNTER — Telehealth: Payer: Self-pay | Admitting: Pulmonary Disease

## 2020-12-16 NOTE — Telephone Encounter (Signed)
Repeat CXR in 1-2 months with follow up in clinic after CXR

## 2020-12-16 NOTE — Telephone Encounter (Signed)
Called and spoke with Patient.  Dr. Shirlee More results given.  Patient requested to know if she needs another cxr and when to follow up with OV.    Results per Dr. Isaiah Serge-  Please let patient know that chest x-ray looks good with resolution of the air outside the lung Her fluid studies are negative so far.   Message routed to Dr.Mannam to advise

## 2020-12-16 NOTE — Telephone Encounter (Signed)
ATC Patient.  LM to call back for results. 

## 2020-12-17 NOTE — Telephone Encounter (Signed)
LMTCB

## 2020-12-17 NOTE — Telephone Encounter (Signed)
Patient is returning phone call. Patient phone number is [phone].

## 2020-12-17 NOTE — Telephone Encounter (Signed)
LMTCB- please schedule 1-2 mo rov with cxr with Dr Isaiah Serge.

## 2020-12-19 NOTE — Telephone Encounter (Signed)
Lmtcb for pt.   Needs 1-2 ROV with Dr. Isaiah Serge with CXR prior.

## 2020-12-31 NOTE — Telephone Encounter (Signed)
Pt has appt with Dr. Isaiah Serge on 3.8.22. Will close encounter.

## 2021-01-29 ENCOUNTER — Ambulatory Visit: Payer: 59 | Admitting: Pulmonary Disease

## 2021-01-29 ENCOUNTER — Encounter: Payer: Self-pay | Admitting: Pulmonary Disease

## 2021-01-29 ENCOUNTER — Ambulatory Visit (INDEPENDENT_AMBULATORY_CARE_PROVIDER_SITE_OTHER): Payer: 59

## 2021-01-29 ENCOUNTER — Other Ambulatory Visit: Payer: Self-pay

## 2021-01-29 VITALS — BP 122/80 | HR 91 | Temp 97.0°F | Ht 65.0 in | Wt 214.0 lb

## 2021-01-29 DIAGNOSIS — J90 Pleural effusion, not elsewhere classified: Secondary | ICD-10-CM | POA: Diagnosis not present

## 2021-01-29 DIAGNOSIS — J939 Pneumothorax, unspecified: Secondary | ICD-10-CM

## 2021-01-29 NOTE — Progress Notes (Signed)
Tamara Walton    [id number]    [birth year]  Primary Care Physician:Jinwala, Marlyce Huge, MD  Referring Physician: Merrilyn Puma, MD [address] [place],  [place] [address]  Chief complaint: Follow-up for left pleural effusion  HPI: 65 year old with history of hypertension, asthma, hyperlipidemia, allergies Complains of cough with dyspnea for about 8 weeks She was admitted on end of November with lung imaging showing moderate left effusion status post thoracentesis which showed exudative effusion with atypical mesothelial cells.  Negative cultures Noted to have recurrence of effusion and sent for repeat thoracentesis on December 17 by IR.  Unfortunately she did not have repeat pleural fluid studies performed.  Overall she feels that the cough has improved but still has some mild dyspnea on exertion Recently had an evaluation for breast cyst with a mammogram which showed benign findings.  She had a stool test for colon cancer which is negative and has a Pap smear pending next month.  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  Quit in 2008 Travel history: No significant travel history Relevant family history: Mother died of lung cancer.  She was a smoker.  Interval history: She was treated with levofloxacin for a week at the end of December 2021 She underwent third thoracentesis on 11/23/2020 with small pneumothorax ex vacuo which resolved spontaneously  Pleural studies show exudative effusion with negative cultures and cytology  She continues to do well with no issues Developed COVID-19 in early February.  She had a mild case and did not require hospitalization.   Outpatient Encounter Medications as of 01/29/2021  Medication Sig  . acetaminophen (TYLENOL) 500 MG tablet Take 1,000 mg by mouth every 6 (six) hours as needed for headache (pain).  Marland Kitchen  albuterol (VENTOLIN HFA) 108 (90 Base) MCG/ACT inhaler Inhale 2 puffs into the lungs every 6 (six) hours as needed for wheezing or shortness of breath.  Marland Kitchen amLODipine (NORVASC) 5 MG tablet Take 5 mg by mouth daily.  . calcium carbonate (TUMS EX) 750 MG chewable tablet Chew 1 tablet by mouth 3 (three) times daily as needed for heartburn.  . fluticasone (FLONASE) 50 MCG/ACT nasal spray Place 1 spray into both nostrils daily.  . hydrochlorothiazide (HYDRODIURIL) 25 MG tablet Take 25 mg by mouth daily.  . montelukast (SINGULAIR) 10 MG tablet Take 10 mg by mouth at bedtime.  . rosuvastatin (CRESTOR) 5 MG tablet Take 5 mg by mouth at bedtime.   No facility-administered encounter medications on file as of 01/29/2021.   Physical Exam: Blood pressure 122/80, pulse 91, temperature (!) 97 F (36.1 C), temperature source Temporal, height 5\' 5"  (1.651 m), weight 214 lb (97.1 kg), SpO2 95 %. Gen:      No acute distress HEENT:  EOMI, sclera anicteric Neck:     No masses; no thyromegaly Lungs:    Clear to auscultation bilaterally; normal respiratory effort CV:         Regular rate and rhythm; no murmurs Abd:      + bowel sounds; soft, non-tender; no palpable masses, no distension Ext:    No edema; adequate peripheral perfusion Skin:      Warm and dry; no rash Neuro: alert and oriented x 3 Psych: normal mood and affect  Data Reviewed: Imaging: CTA 10/12/2020-no PE, left effusion with atelectasis, left upper lobe consolidation, multinodular thyroid gland. Chest x-ray 11/29-moderate  left effusion Chest x-ray 12/17-left mid and lower lung consolidation CT chest 11/19/2020-moderate left effusion with left lung atelectasis, scattered mediastinal lymph nodes, multinodular goiter Chest x-ray 1/19-decrease in size of pleural effusion, resolution of small left pneumothorax Chest x-ray 01/29/2021-minimal left pleural effusion, clear lungs I have reviewed the images personally.  PFTs:  Labs: Pleural studies  10/13/2020- Cell count 3740, 47% lymphs, 32% neutrophil, 21% monocyte macrophage Microbiology-negative Cytology atypical mesothelial cells Pleural fluid protein/ Serum protein 7.2= 0.68 Pleural fluid LDH 159/ Serum LDH 133=1.19  Pleural fluid LDH> 2/3 Serum LDH  Pleural fluid studies 12/02/2020 Cell count 3668, 75% lymphs Microbiology negative Cytology reactive mesothelial and lymphoid cells  Assessment:  Recurrent left effusion, exudative Differential diagnosis includes pneumonia, aspiration.  She does have history of choking on pills and CT scan shows some consolidation with bronchograms in the left upper lobe.  She has been treated with antibiotics No evidence of malignancy on cytology x2  Repeat chest x-ray reviewed with minimal effusion on the left. Continue monitoring We will get a CT scan to make sure were not missing anything.  This will also enable [place] to see if there is any persistent inflammation after recent bout of Covid  Plan/Recommendations: -CT scan without contrast in 1 month  Follow-up in clinic in 6 months.  Chilton Greathouse MD [facility] 01/29/2021, 9:42 AM  CC: Merrilyn Puma, MD

## 2021-01-29 NOTE — Addendum Note (Signed)
Addended by: Dorisann Frames R on: 01/29/2021 10:39 AM   Modules accepted: Orders

## 2021-01-29 NOTE — Patient Instructions (Signed)
Glad you are doing well with your breathing I have reviewed your chest x-ray which shows minimal fluid around the left lung  We will order CT chest without contrast in 1 month  Follow-up in clinic in 6 months

## 2021-02-28 ENCOUNTER — Other Ambulatory Visit: Payer: Self-pay

## 2021-02-28 ENCOUNTER — Ambulatory Visit (INDEPENDENT_AMBULATORY_CARE_PROVIDER_SITE_OTHER)
Admission: RE | Admit: 2021-02-28 | Discharge: 2021-02-28 | Disposition: A | Discharge status: Home / Self Care | Payer: 59 | Source: Ambulatory Visit | Attending: Pulmonary Disease | Admitting: Pulmonary Disease

## 2021-02-28 DIAGNOSIS — J90 Pleural effusion, not elsewhere classified: Secondary | ICD-10-CM

## 2021-03-18 ENCOUNTER — Ambulatory Visit: Payer: 59

## 2021-03-18 ENCOUNTER — Telehealth: Payer: Self-pay | Admitting: Pulmonary Disease

## 2021-03-18 DIAGNOSIS — J90 Pleural effusion, not elsewhere classified: Secondary | ICD-10-CM

## 2021-03-18 NOTE — Telephone Encounter (Signed)
CT shows significantly improved effusion.  There are small residual pocket of fluid and scarring which does not appear significant on my review.  There are no worrisome findings.  We will continue to monitor.  Make sure she has a follow-up chest x-ray in 6 months and return to clinic after

## 2021-03-18 NOTE — Telephone Encounter (Signed)
Called patient. Let her know that I would send to Dr. Isaiah Serge to result the CT and we would call her back when we heard from him.  Sending to Dr. Isaiah Serge for results and recommendations in regards to CT chest from 02/28/21

## 2021-03-18 NOTE — Telephone Encounter (Signed)
Called spoke with patient. Let her know Dr. Shirlee More recommendations She voiced understanding Placed a recall for patient.  Placed order for chest xray to have prior to visit.  Nothing further needed at this time.

## 2021-03-26 ENCOUNTER — Encounter: Payer: Self-pay | Admitting: Acute Care

## 2021-03-26 ENCOUNTER — Ambulatory Visit (INDEPENDENT_AMBULATORY_CARE_PROVIDER_SITE_OTHER): Payer: 59

## 2021-03-26 ENCOUNTER — Ambulatory Visit: Payer: 59 | Admitting: Acute Care

## 2021-03-26 ENCOUNTER — Other Ambulatory Visit: Payer: Self-pay

## 2021-03-26 VITALS — BP 134/84 | HR 105 | Temp 97.3°F | Ht 65.0 in | Wt 214.6 lb

## 2021-03-26 DIAGNOSIS — J90 Pleural effusion, not elsewhere classified: Secondary | ICD-10-CM

## 2021-03-26 DIAGNOSIS — Z0000 Encounter for general adult medical examination without abnormal findings: Secondary | ICD-10-CM

## 2021-03-26 NOTE — Patient Instructions (Addendum)
It is good to see you today. We will do a CXR now to see if you have a recurrent pleural effusion.  We will call you with results  Continue your allergy medications as you have been doing.  Pneumococcal vaccine today Please contact office for sooner follow up if symptoms do not improve or worsen or seek emergency care

## 2021-03-26 NOTE — Progress Notes (Addendum)
History of Present Illness Tamara Walton is a 65 y.o. female with  history of hypertension, asthma, hyperlipidemia, allergies. Recent hospitalization 09/2020 for lung imaging showing moderate left effusion status post thoracentesis which showed exudative effusion with atypical mesothelial cells. Recurrent effusion 11/08/2021 requiring repeat Thoracentesis, but pleural fluid studies were not performed. Pt. Is followed by Dr.Mannam  Interval history: She was treated with levofloxacin for a week at the end of December 2021 She underwent third thoracentesis on 11/23/2020 with small pneumothorax ex vacuo which resolved spontaneously  Pleural studies show exudative effusion with negative cultures and cytology  Developed COVID-19 in early February.  She had a mild case and did not require hospitalization.  Last seen by Dr. Isaiah Serge 01/29/2021. At that time she had differential diagnoses that included pneumonia, aspiration.   She has a history of choking on pills and CT scan showed some consolidation with bronchograms in the left upper lobe.   She was been treated with antibiotics. Repeat CXR showed minimal effusion on L. Plan was continued monitoring, and CT Chest without  No evidence of malignancy on cytology x2   03/26/2021  Pt.presents for an acute visit. She recently had a sinus infection and was seen for this at [facility]. She was treated with Augmentin x 5 days, and states she is better. Secretions are clear. She states she had her initial Pleural effusion developed after a sinus infection. Today she has started having a sensation of heaviness in her left lung.She is concerned this is another effusion.  She states she gets a tight feeling with inspiration. She would like to be assured that this is not another effusion. We will order a stat CXR. No fever, no cough, no purulent secretions.   Test Results: 02/28/2021 CT Chest Small, loculated appearing residual left pleural  effusion, significantly decreased in volume compared to prior examination. Correspondingly improved aeration of the left lower lobe and lingula. Bandlike scarring and or atelectasis of the left lower lobe. Heterogeneous, multinodular thyroid. Recommend thyroid ultrasound when clinically appropriate (ref: J Am Coll Radiol. 2015 Feb;12(2):  CBC Latest Ref Rng & Units 10/13/2020 10/12/2020 07/28/2020  WBC 4.0 - 10.5 K/uL 9.3 11.4(H) 11.5(H)  Hemoglobin 12.0 - 15.0 g/dL 11.4(L) 12.6 13.4  Hematocrit 36.0 - 46.0 % 35.4(L) 39.4 41.0  Platelets 150 - 400 K/uL 468(H) 533(H) 359    BMP Latest Ref Rng & Units 10/13/2020 10/13/2020 10/12/2020  Glucose 70 - 99 mg/dL 253(G) 644(I) 347(Q)  BUN 8 - 23 mg/dL 14 11 16   Creatinine 0.44 - 1.00 mg/dL 2.59 5.63  Sodium 135 - 145 mmol/L 135 136 136  Potassium 3.5 - 5.1 mmol/L 3.9 3.5 3.9  Chloride 98 - 111 mmol/L 98 98 97(L)  CO2 22 - 32 mmol/L 27 29 28   Calcium 8.9 - 10.3 mg/dL 9.0 8.9 9.2    BNP No results found for: BNP  ProBNP No results found for: PROBNP  PFT No results found for: FEV1PRE, FEV1POST, FVCPRE, FVCPOST, TLC, DLCOUNC, PREFEV1FVCRT, PSTFEV1FVCRT  CT Chest Wo Contrast  Result Date: 03/02/2021 CLINICAL DATA:  Pleural effusion, history of pneumonia with hospitalization November, multiple thoracentesis, recent COVID, shortness of breath EXAM: CT CHEST WITHOUT CONTRAST TECHNIQUE: Multidetector CT imaging of the chest was performed following the standard protocol without IV contrast. COMPARISON:  CT chest, 11/19/2020 FINDINGS: Cardiovascular: No significant vascular findings. Normal heart size. No pericardial effusion. Mediastinum/Nodes: No enlarged mediastinal, hilar, or axillary lymph nodes. Heterogeneous, multinodular thyroid. Trachea, and esophagus demonstrate no significant findings.  Lungs/Pleura: Small, loculated appearing residual left pleural effusion, significantly decreased in volume compared to prior examination.  Correspondingly improved aeration of the left lower lobe and lingula. Bandlike scarring and or atelectasis of the left lower lobe. Upper Abdomen: No acute abnormality. Musculoskeletal: No chest wall mass or suspicious bone lesions identified. IMPRESSION: 1. Small, loculated appearing residual left pleural effusion, significantly decreased in volume compared to prior examination. Correspondingly improved aeration of the left lower lobe and lingula. 2. Bandlike scarring and or atelectasis of the left lower lobe. 3. Heterogeneous, multinodular thyroid. Recommend thyroid ultrasound when clinically appropriate (ref: J Am Coll Radiol. 2015 Feb;12(2): 143-50). Electronically Signed   By: Lauralyn Primes M.D.   On: 03/02/2021 19:12     Past medical hx Past Medical History:  Diagnosis Date  . Hypertension      Social History   Tobacco Use  . Smoking status: Former Smoker    Packs/day: 1.00    Years: 25.00    Pack years: 25.00    Types: Cigarettes  . Smokeless tobacco: Never Used  Substance Use Topics  . Alcohol use: Never    Tamara Walton reports that she has quit smoking. Her smoking use included cigarettes. She has a 25.00 pack-year smoking history. She has never used smokeless tobacco. She reports that she does not drink alcohol.  Tobacco Cessation: Former smoker with a 25 pack year smoking history  Past surgical hx, Family hx, Social hx all reviewed.  Current Outpatient Medications on File Prior to Visit  Medication Sig  . acetaminophen (TYLENOL) 500 MG tablet Take 1,000 mg by mouth every 6 (six) hours as needed for headache (pain).  Marland Kitchen albuterol (VENTOLIN HFA) 108 (90 Base) MCG/ACT inhaler Inhale 2 puffs into the lungs every 6 (six) hours as needed for wheezing or shortness of breath.  Marland Kitchen amLODipine (NORVASC) 5 MG tablet Take 5 mg by mouth daily.  . calcium carbonate (TUMS EX) 750 MG chewable tablet Chew 1 tablet by mouth 3 (three) times daily as needed for heartburn.  . fluticasone (FLONASE)  50 MCG/ACT nasal spray Place 1 spray into both nostrils daily.  . hydrochlorothiazide (HYDRODIURIL) 25 MG tablet Take 25 mg by mouth daily.  . montelukast (SINGULAIR) 10 MG tablet Take 10 mg by mouth at bedtime.  . rosuvastatin (CRESTOR) 5 MG tablet Take 5 mg by mouth at bedtime.   No current facility-administered medications on file prior to visit.     Allergies  Allergen Reactions  . Erythromycin Nausea And Vomiting  . Levaquin [Levofloxacin]     Joint pain  . Strawberry (Diagnostic) Hives  . Strawberry Extract Hives  . Amoxicillin-Pot Clavulanate Nausea And Vomiting and Nausea Only  . Lisinopril Cough    Review Of Systems:  Constitutional:   No  weight loss, night sweats,  Fevers, chills, fatigue, or  lassitude.  HEENT:   No headaches,  Difficulty swallowing,  Tooth/dental problems, or  Sore throat,                No sneezing, itching, ear ache, nasal congestion, post nasal drip,   CV:  No chest pain,  Orthopnea, PND, swelling in lower extremities, anasarca, dizziness, palpitations, syncope.   GI  No heartburn, indigestion, abdominal pain, nausea, vomiting, diarrhea, change in bowel habits, loss of appetite, bloody stools.   Resp: No shortness of breath with exertion or at rest.  No excess mucus, no productive cough,  No non-productive cough,  No coughing up of blood.  No change in color of  mucus.  No wheezing.  No chest wall deformity, L sided chest heaviness with inspiration   Skin: no rash or lesions.  GU: no dysuria, change in color of urine, no urgency or frequency.  No flank pain, no hematuria   MS:  No joint pain or swelling.  No decreased range of motion.  No back pain.  Psych:  No change in mood or affect. No depression or anxiety.  No memory loss.   Vital Signs BP 134/84 (BP Location: Left Arm, Cuff Size: Normal)   Pulse (!) 105   Temp (!) 97.3 F (36.3 C)   Ht 5\' 5"  (1.651 m)   Wt 214 lb 9.6 oz (97.3 kg)   SpO2 97%   BMI 35.71 kg/m    Physical  Exam:  General- No distress,  A&Ox3, Appropriate ENT: No sinus tenderness, TM clear, pale nasal mucosa, no oral exudate,no post nasal drip, no LAN Cardiac: S1, S2, regular rate and rhythm, no murmur Chest: No wheeze/ rales/ dullness; no accessory muscle use, no nasal flaring, no sternal retractions Abd.: Soft Non-tender, ND, BS +, Body mass index is 35.71 kg/m. Ext: No clubbing cyanosis, edema Neuro:  normal strength, MAE x 4, A&O x 3, appropriate Skin: No rashes, warm and dry Psych: normal mood and behavior   Assessment/Plan  L chest heaviness with deep inspiration Recent sinus infection treated with Augmentin Plan We will do a CXR now to see if you have a recurrent pleural effusion.  We will call you with results  Continue your allergy medications as you have been doing.  Pneumococcal vaccine today Please contact office for sooner follow up if symptoms do not improve or worsen or seek emergency care     , NP 03/26/2021  5:07 PM

## 2021-03-27 NOTE — Progress Notes (Signed)
I have called the patient with the results of her CXR. Pleural effusion is small and stable. She is very relieved. She verbalized understanding. I have asked her to call if she has any changes, or concerns , that we are here whenever she needs [place].

## 2021-08-21 ENCOUNTER — Ambulatory Visit: Payer: 59 | Admitting: Pulmonary Disease

## 2021-08-21 ENCOUNTER — Ambulatory Visit (INDEPENDENT_AMBULATORY_CARE_PROVIDER_SITE_OTHER): Payer: 59

## 2021-08-21 ENCOUNTER — Encounter: Payer: Self-pay | Admitting: Pulmonary Disease

## 2021-08-21 ENCOUNTER — Other Ambulatory Visit: Payer: Self-pay

## 2021-08-21 VITALS — BP 144/82 | HR 84 | Temp 97.6°F | Ht 65.0 in | Wt 222.2 lb

## 2021-08-21 DIAGNOSIS — J90 Pleural effusion, not elsewhere classified: Secondary | ICD-10-CM

## 2021-08-21 NOTE — Progress Notes (Signed)
Tamara Walton    [id number]    [birth year]  Primary Care Physician:Jinwala, Marlyce Huge, MD  Referring Physician: Merrilyn Puma, MD [address] [place],  [place] [address]  Chief complaint: Follow-up for left pleural effusion  HPI: 65 year old with history of hypertension, asthma, hyperlipidemia, allergies  She was admitted on end of November 2021 with lung imaging showing moderate left effusion status post thoracentesis which showed exudative effusion with atypical mesothelial cells.  Negative cultures Noted to have recurrence of effusion and sent for repeat thoracentesis on November 08, 2020 by IR.  Unfortunately she did not have repeat pleural fluid studies performed.  She was treated with levofloxacin for a week at the end of December 2021 She underwent third thoracentesis on 11/23/2020 with small pneumothorax ex vacuo which resolved spontaneously Pleural studies show exudative effusion with negative cultures and cytology  Developed COVID-19 in early February 2022.  She had a mild case and did not require hospitalization.  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  Quit in 2008 Travel history: No significant travel history Relevant family history: Mother died of lung cancer.  She was a smoker.  Interval history: Here for follow-up of pleural effusion. States that breathing is doing well with no dyspnea.  No cough, sputum production, fevers, chills She does have some mild discomfort on the left when she takes a deep breath.  Outpatient Encounter Medications as of 08/21/2021  Medication Sig   acetaminophen (TYLENOL) 500 MG tablet Take 1,000 mg by mouth every 6 (six) hours as needed for headache (pain).   albuterol (VENTOLIN HFA) 108 (90 Base) MCG/ACT inhaler Inhale 2 puffs into the lungs every 6 (six) hours as needed for wheezing or shortness  of breath.   calcium carbonate (TUMS EX) 750 MG chewable tablet Chew 1 tablet by mouth 3 (three) times daily as needed for heartburn.   fluticasone (FLONASE) 50 MCG/ACT nasal spray Place 1 spray into both nostrils daily.   hydrochlorothiazide (HYDRODIURIL) 25 MG tablet Take 12.5 mg by mouth daily. Taking 12.5mg  daily   montelukast (SINGULAIR) 10 MG tablet Take 10 mg by mouth at bedtime.   olmesartan (BENICAR) 5 MG tablet Take 10 mg by mouth daily.   rosuvastatin (CRESTOR) 5 MG tablet Take 5 mg by mouth at bedtime.   [DISCONTINUED] amLODipine (NORVASC) 5 MG tablet Take 5 mg by mouth daily.   No facility-administered encounter medications on file as of 08/21/2021.   Physical Exam: Blood pressure (!) 144/82, pulse 84, temperature 97.6 F (36.4 C), temperature source Oral, height 5\' 5"  (1.651 m), weight 222 lb 3.2 oz (100.8 kg), SpO2 99 %. Gen:      No acute distress HEENT:  EOMI, sclera anicteric Neck:     No masses; no thyromegaly Lungs:    Clear to auscultation bilaterally; normal respiratory effort CV:         Regular rate and rhythm; no murmurs Abd:      + bowel sounds; soft, non-tender; no palpable masses, no distension Ext:    No edema; adequate peripheral perfusion Skin:      Warm and dry; no rash Neuro: alert and oriented x 3 Psych: normal mood and affect   Data Reviewed: Imaging: CTA 10/12/2020-no PE, left effusion with atelectasis, left upper lobe consolidation, multinodular thyroid gland. Chest x-ray 11/29-moderate left effusion Chest x-ray 12/17-left mid and lower  lung consolidation CT chest 11/19/2020-moderate left effusion with left lung atelectasis, scattered mediastinal lymph nodes, multinodular goiter Chest x-ray 1/19-decrease in size of pleural effusion, resolution of small left pneumothorax Chest x-ray 01/29/2021-minimal left pleural effusion, clear lungs CT chest 02/28/2021-small loculated left pleural effusion, atelectasis in the left lower lobe, multinodular  goiter. Chest x-ray 08/21/2021-improvement in effusion I have reviewed the images personally.  PFTs:  Labs: Pleural studies 10/13/2020- Cell count 3740, 47% lymphs, 32% neutrophil, 21% monocyte macrophage Microbiology-negative Cytology atypical mesothelial cells Pleural fluid protein/ Serum protein 7.2= 0.68 Pleural fluid LDH 159/ Serum LDH 133=1.19  Pleural fluid LDH> 2/3 Serum LDH  Pleural fluid studies 12/02/2020 Cell count 3668, 75% lymphs Microbiology negative Cytology reactive mesothelial and lymphoid cells  Assessment:  Recurrent left effusion, exudative Differential diagnosis includes pneumonia, aspiration.  She does have history of choking on pills and CT scan shows some consolidation with bronchograms in the left upper lobe.  She has been treated with antibiotics No evidence of malignancy on cytology x2  I reviewed her chest x-ray today which shows resolution of the small effusion seen earlier this year.  This is reassuring Continue monitoring Follow-up CT in 6 months  Multinodular goiter Noted on CT chest earlier this year.  I reviewed these findings with the patient.  This is apparently a known problem and she will follow-up with her primary care regarding this.  Plan/Recommendations: -CT chest in 6 months.  Follow-up in clinic in 6 months.  Chilton Greathouse MD [facility] 08/21/2021, 9:46 AM  CC: Merrilyn Puma, MD

## 2021-08-21 NOTE — Patient Instructions (Addendum)
Your x-ray shows continued improvement of the effusion which is good news We will continue to monitor this  I will order a follow-up CT chest without contrast in 6 months Return to clinic after CT scan.

## 2021-11-21 ENCOUNTER — Other Ambulatory Visit: Payer: Self-pay | Admitting: Home Modifications

## 2021-11-21 DIAGNOSIS — Z1231 Encounter for screening mammogram for malignant neoplasm of breast: Secondary | ICD-10-CM

## 2021-12-11 ENCOUNTER — Other Ambulatory Visit: Payer: Self-pay

## 2021-12-11 ENCOUNTER — Ambulatory Visit
Admission: RE | Admit: 2021-12-11 | Discharge: 2021-12-11 | Disposition: A | Discharge status: Home / Self Care | Payer: 59 | Source: Ambulatory Visit | Attending: Home Modifications | Admitting: Home Modifications

## 2021-12-11 DIAGNOSIS — Z1231 Encounter for screening mammogram for malignant neoplasm of breast: Secondary | ICD-10-CM

## 2021-12-12 ENCOUNTER — Other Ambulatory Visit: Payer: Self-pay | Admitting: Home Modifications

## 2021-12-12 DIAGNOSIS — R928 Other abnormal and inconclusive findings on diagnostic imaging of breast: Secondary | ICD-10-CM

## 2021-12-31 ENCOUNTER — Ambulatory Visit: Payer: 59

## 2021-12-31 ENCOUNTER — Ambulatory Visit
Admission: RE | Admit: 2021-12-31 | Discharge: 2021-12-31 | Disposition: A | Discharge status: Home / Self Care | Payer: 59 | Source: Ambulatory Visit | Attending: Home Modifications | Admitting: Home Modifications

## 2021-12-31 ENCOUNTER — Other Ambulatory Visit: Payer: Self-pay

## 2021-12-31 DIAGNOSIS — R928 Other abnormal and inconclusive findings on diagnostic imaging of breast: Secondary | ICD-10-CM

## 2022-01-13 ENCOUNTER — Other Ambulatory Visit: Payer: 59

## 2022-02-16 ENCOUNTER — Other Ambulatory Visit: Payer: Self-pay

## 2022-02-16 ENCOUNTER — Ambulatory Visit
Admission: RE | Admit: 2022-02-16 | Discharge: 2022-02-16 | Disposition: A | Discharge status: Home / Self Care | Payer: 59 | Source: Ambulatory Visit | Attending: Pulmonary Disease | Admitting: Pulmonary Disease

## 2022-02-16 DIAGNOSIS — J90 Pleural effusion, not elsewhere classified: Secondary | ICD-10-CM

## 2022-02-25 ENCOUNTER — Telehealth: Payer: Self-pay | Admitting: Pulmonary Disease

## 2022-02-26 NOTE — Telephone Encounter (Signed)
I called the patient and gave her the results per Dr. Isaiah Serge and she voices understanding. I have made a PFT and she will call abck to make follow up after that OV. Nothing further needed.  ?

## 2022-04-03 ENCOUNTER — Other Ambulatory Visit: Payer: Self-pay | Admitting: Pulmonary Disease

## 2022-04-03 DIAGNOSIS — J90 Pleural effusion, not elsewhere classified: Secondary | ICD-10-CM

## 2022-04-06 ENCOUNTER — Ambulatory Visit (INDEPENDENT_AMBULATORY_CARE_PROVIDER_SITE_OTHER): Payer: 59 | Admitting: Pulmonary Disease

## 2022-04-06 DIAGNOSIS — J90 Pleural effusion, not elsewhere classified: Secondary | ICD-10-CM | POA: Diagnosis not present

## 2022-04-06 LAB — PULMONARY FUNCTION TEST
DL/VA % pred: 115 %
DL/VA: 4.8 ml/min/mmHg/L
DLCO cor % pred: 84 %
DLCO cor: 17.15 ml/min/mmHg
DLCO unc % pred: 84 %
DLCO unc: 17.15 ml/min/mmHg
FEF 25-75 Post: 2.26 L/sec
FEF 25-75 Pre: 2.1 L/sec
FEF2575-%Change-Post: 7 %
FEF2575-%Pred-Post: 102 %
FEF2575-%Pred-Pre: 95 %
FEV1-%Change-Post: 1 %
FEV1-%Pred-Post: 74 %
FEV1-%Pred-Pre: 73 %
FEV1-Post: 1.85 L
FEV1-Pre: 1.82 L
FEV1FVC-%Change-Post: 3 %
FEV1FVC-%Pred-Pre: 107 %
FEV6-%Change-Post: -1 %
FEV6-%Pred-Post: 69 %
FEV6-%Pred-Pre: 70 %
FEV6-Post: 2.16 L
FEV6-Pre: 2.2 L
FEV6FVC-%Pred-Post: 103 %
FEV6FVC-%Pred-Pre: 103 %
FVC-%Change-Post: -1 %
FVC-%Pred-Post: 66 %
FVC-%Pred-Pre: 67 %
FVC-Post: 2.16 L
FVC-Pre: 2.2 L
Post FEV1/FVC ratio: 86 %
Post FEV6/FVC ratio: 100 %
Pre FEV1/FVC ratio: 83 %
Pre FEV6/FVC Ratio: 100 %
RV % pred: 63 %
RV: 1.32 L
TLC % pred: 71 %
TLC: 3.65 L

## 2022-04-06 NOTE — Progress Notes (Signed)
PFT done today. 

## 2022-04-10 ENCOUNTER — Ambulatory Visit: Payer: 59 | Admitting: Pulmonary Disease

## 2022-04-10 ENCOUNTER — Encounter: Payer: Self-pay | Admitting: Pulmonary Disease

## 2022-04-10 VITALS — BP 126/76 | HR 89 | Temp 97.8°F | Ht 65.0 in | Wt 233.8 lb

## 2022-04-10 DIAGNOSIS — J90 Pleural effusion, not elsewhere classified: Secondary | ICD-10-CM | POA: Diagnosis not present

## 2022-04-10 MED ORDER — PREDNISONE 20 MG PO TABS: 40.0000 mg | ORAL_TABLET | Freq: Every day | ORAL | 0 refills | Status: DC

## 2022-04-10 MED ORDER — AMOXICILLIN-POT CLAVULANATE 875-125 MG PO TABS: 1.0000 | ORAL_TABLET | Freq: Two times a day (BID) | ORAL | 0 refills | Status: DC

## 2022-04-10 NOTE — Patient Instructions (Signed)
Glad you are doing well with your breathing The CT scan shows resolution of the fluid around the lung and lung function test did not show COPD which is good news For sinusitis we will call in Augmentin 875 mg twice daily for 5 days and prednisone 40 mg a day for 5 days  Return to clinic as needed.

## 2022-04-10 NOTE — Progress Notes (Signed)
Tamara Walton    [id number]    May 09, 1956  Primary Care Physician:[facility]  Referring Physician: Virl Axe, MD [place],  [place] [address]  Chief complaint: Follow-up for left pleural effusion  HPI: 66 year old with history of hypertension, asthma, hyperlipidemia, allergies  She was admitted on end of November 2021 with lung imaging showing moderate left effusion status post thoracentesis which showed exudative effusion with atypical mesothelial cells.  Negative cultures Noted to have recurrence of effusion and sent for repeat thoracentesis on November 08, 2020 by IR.  Unfortunately she did not have repeat pleural fluid studies performed.  She was treated with levofloxacin for a week at the end of December 2021 She underwent third thoracentesis on 11/23/2020 with small pneumothorax ex vacuo which resolved spontaneously Pleural studies show exudative effusion with negative cultures and cytology  Developed COVID-19 in early February 2022.  She had a mild case and did not require hospitalization.  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  No feather pillows or comforters Smoking history: 30-pack-year smoker.  Quit in 2008 Travel history: No significant travel history Relevant family history: Mother died of lung cancer.  She was a smoker.  Interval history: Here for follow-up of pleural effusion. States that breathing is doing well with no dyspnea.  No cough, sputum production, fevers, chills She is here for review of CT and PFTs  Has sinus congestion, green nasal discharge for the past 1 week with sinus heaviness and postnasal drip.   Outpatient Encounter Medications as of 04/10/2022  Medication Sig   acetaminophen (TYLENOL) 500 MG tablet Take 1,000 mg by mouth every 6 (six) hours as needed for headache (pain).   albuterol (VENTOLIN HFA) 108 (90 Base) MCG/ACT  inhaler Inhale 2 puffs into the lungs every 6 (six) hours as needed for wheezing or shortness of breath.   calcium carbonate (TUMS EX) 750 MG chewable tablet Chew 1 tablet by mouth 3 (three) times daily as needed for heartburn.   fluticasone (FLONASE) 50 MCG/ACT nasal spray Place 1 spray into both nostrils daily.   hydrochlorothiazide (HYDRODIURIL) 25 MG tablet Take 12.5 mg by mouth daily. Taking 12.5mg  daily   montelukast (SINGULAIR) 10 MG tablet Take 10 mg by mouth at bedtime.   olmesartan (BENICAR) 5 MG tablet Take 10 mg by mouth daily.   rosuvastatin (CRESTOR) 10 MG tablet Take 10 mg by mouth at bedtime.   [DISCONTINUED] rosuvastatin (CRESTOR) 5 MG tablet Take 5 mg by mouth at bedtime.   No facility-administered encounter medications on file as of 04/10/2022.   Physical Exam: Blood pressure (!) 144/82, pulse 84, temperature 97.6 F (36.4 C), temperature source Oral, height 5\' 5"  (1.651 m), weight 222 lb 3.2 oz (100.8 kg), SpO2 99 %. Gen:      No acute distress HEENT:  EOMI, sclera anicteric Neck:     No masses; no thyromegaly Lungs:    Clear to auscultation bilaterally; normal respiratory effort CV:         Regular rate and rhythm; no murmurs Abd:      + bowel sounds; soft, non-tender; no palpable masses, no distension Ext:    No edema; adequate peripheral perfusion Skin:      Warm and dry; no rash Neuro: alert and oriented x 3 Psych: normal mood and affect   Data Reviewed: Imaging: CTA 10/12/2020-no PE, left effusion with atelectasis, left  upper lobe consolidation, multinodular thyroid gland. Chest x-ray 11/29-moderate left effusion Chest x-ray 12/17-left mid and lower lung consolidation CT chest 11/19/2020-moderate left effusion with left lung atelectasis, scattered mediastinal lymph nodes, multinodular goiter Chest x-ray 1/19-decrease in size of pleural effusion, resolution of small left pneumothorax Chest x-ray 01/29/2021-minimal left pleural effusion, clear lungs CT chest  02/28/2021-small loculated left pleural effusion, atelectasis in the left lower lobe, multinodular goiter. Chest x-ray 08/21/2021-improvement in effusion I have reviewed the images personally.  PFTs:  Labs: Pleural studies 10/13/2020- Cell count 3740, 47% lymphs, 32% neutrophil, 21% monocyte macrophage Microbiology-negative Cytology atypical mesothelial cells Pleural fluid protein/ Serum protein 7.2= 0.68 Pleural fluid LDH 159/ Serum LDH 133=1.19  Pleural fluid LDH> 2/3 Serum LDH  Pleural fluid studies 12/02/2020 Cell count 3668, 75% lymphs Microbiology negative Cytology reactive mesothelial and lymphoid cells  Assessment:  Recurrent left effusion, exudative Differential diagnosis includes pneumonia, aspiration.  She does have history of choking on pills and CT scan shows some consolidation with bronchograms in the left upper lobe.  She has been treated with antibiotics No evidence of malignancy on cytology x2  She had a recent CT scan which did not show any recurrence of pleural effusion.  There was evidence of emphysema however follow-up PFTs do not show any obstruction.  Lung volumes are slightly reduced which is secondary to body habitus.  Since she is doing well she can follow-up in clinic as needed  Acute sinusitis Prescribe Augmentin and prednisone for 5 days  Multinodular goiter Noted on CT chest.  I reviewed these findings with the patient.  This is apparently a known problem and she will follow-up with her primary care regarding this.  Plan/Recommendations: Augmentin, prednisone for 5 days Follow-up in pulmonary clinic as needed  Marshell Garfinkel MD [facility] 04/10/2022, 4:03 PM  CC: Virl Axe, MD

## 2022-04-21 IMAGING — MG DIGITAL SCREENING BILAT W/ TOMO W/ CAD
8 series · 9 of 24 positions shown · non-contrast
Comparison: Previous exam(s).

CLINICAL DATA: Screening.

EXAM:
DIGITAL SCREENING BILATERAL MAMMOGRAM WITH TOMO AND CAD

[L CC synth-2D]
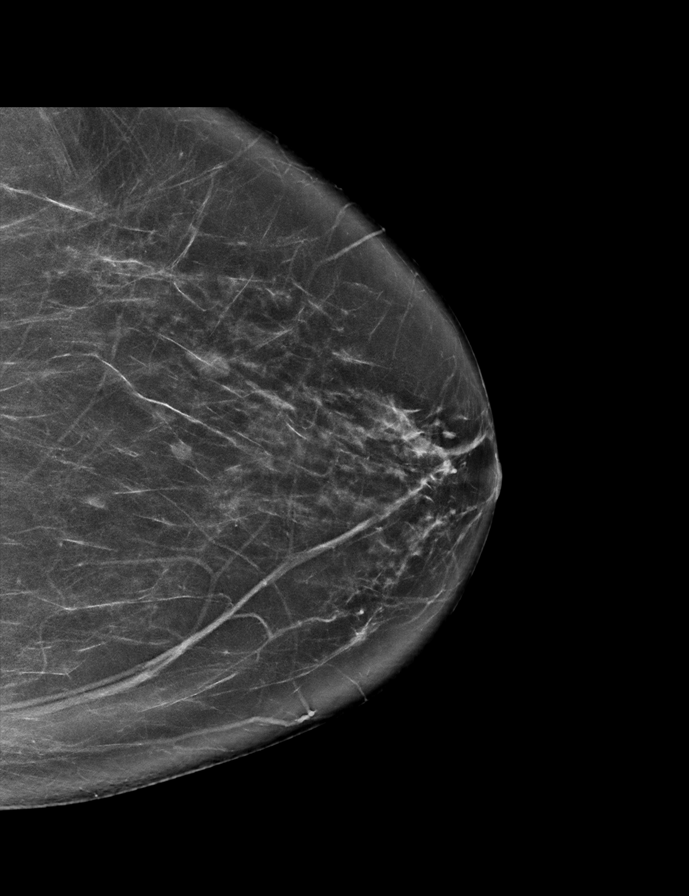

[L MLO synth-2D]
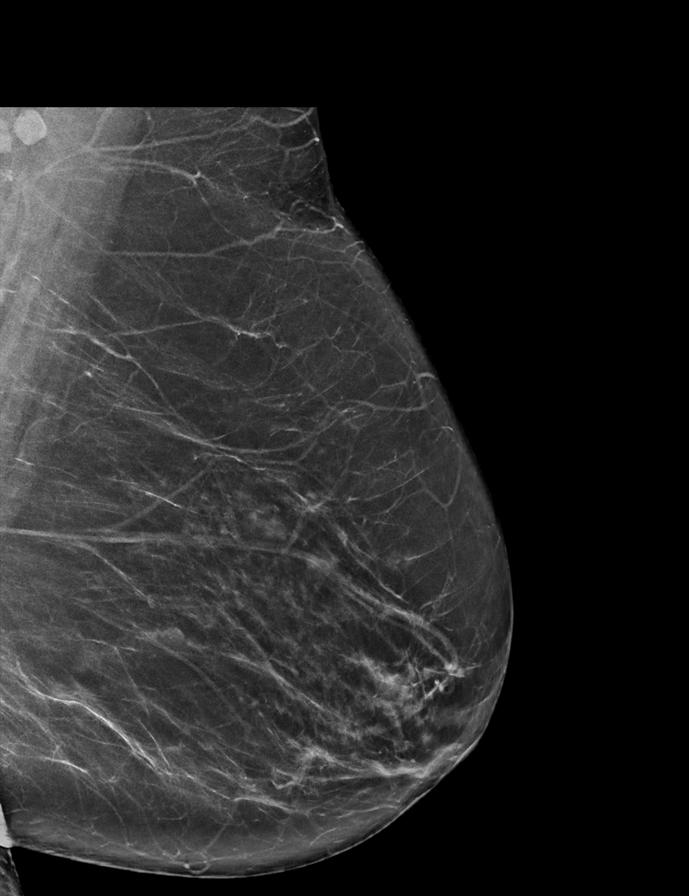

[R MLO synth-2D]
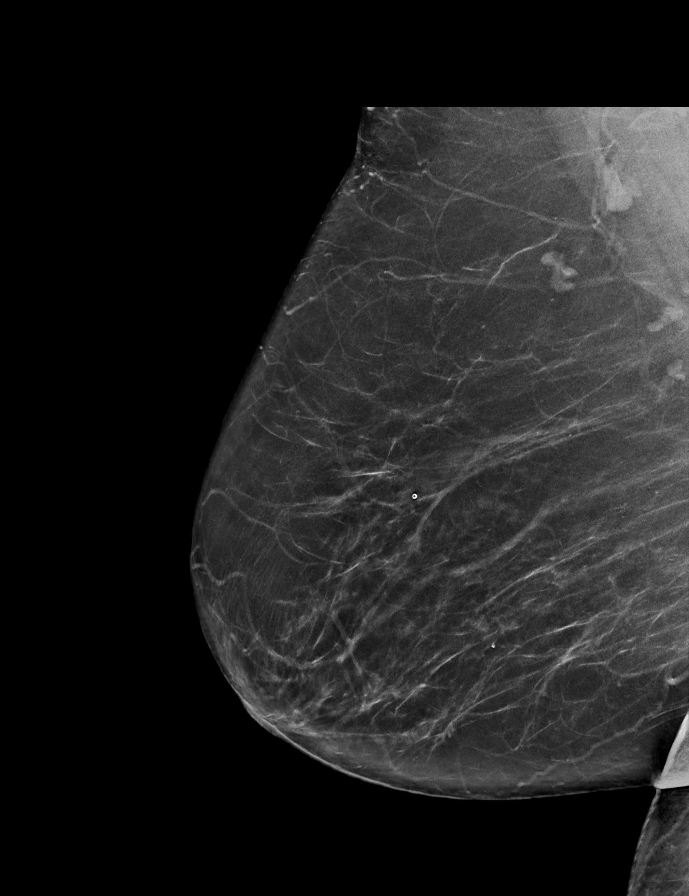

[R CC synth-2D]
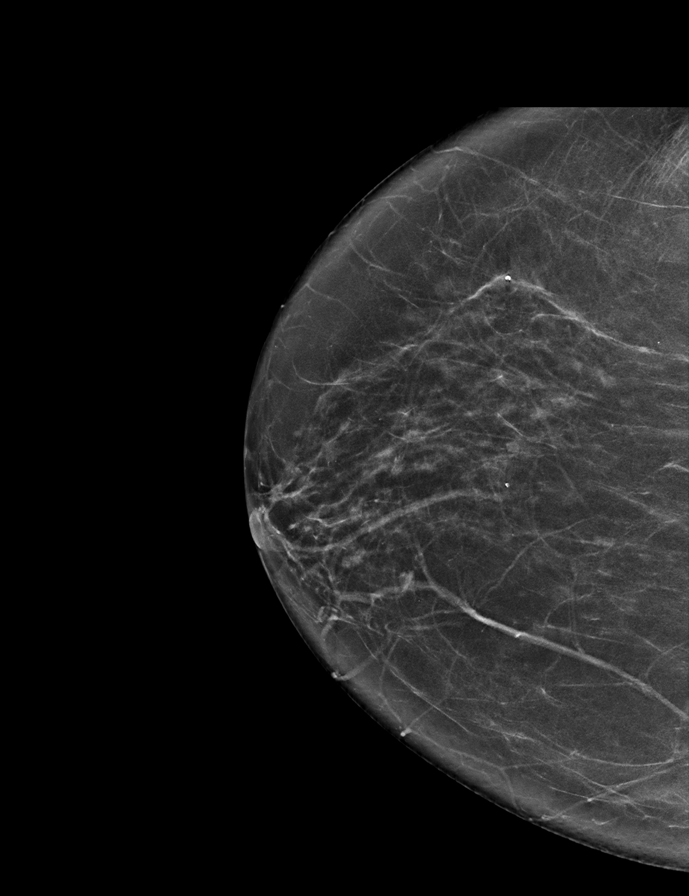

[R CC tomo · 2 of 62 frames shown]
[frame 21/62]
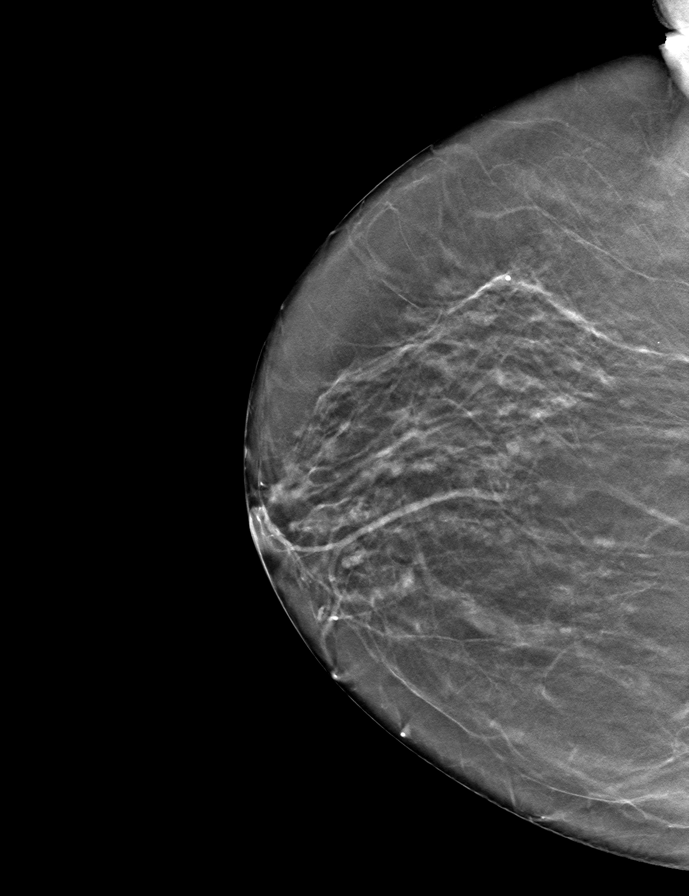
[frame 31/62]
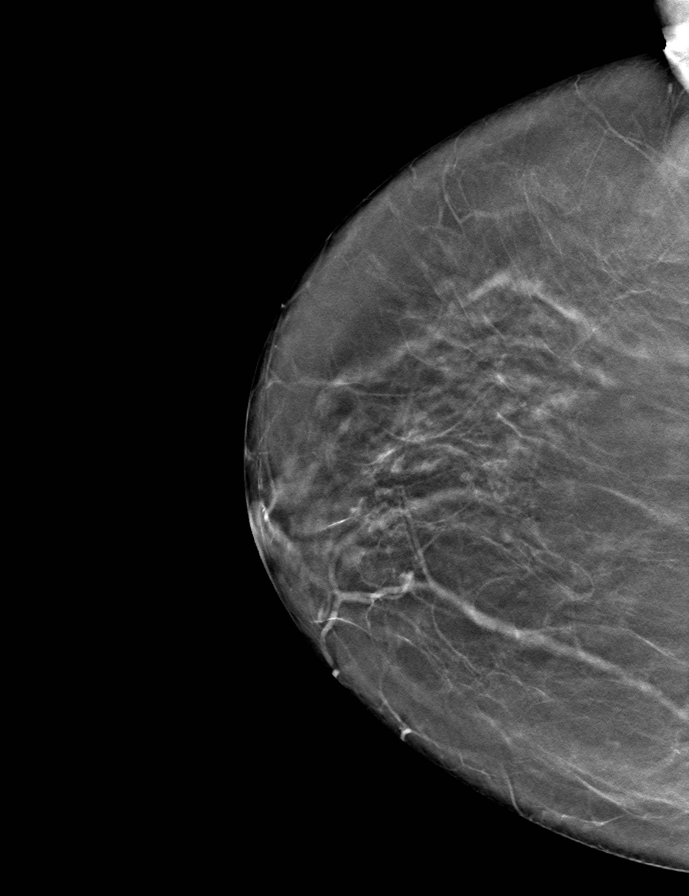

[L CC tomo · tomo slice 35/69.0]
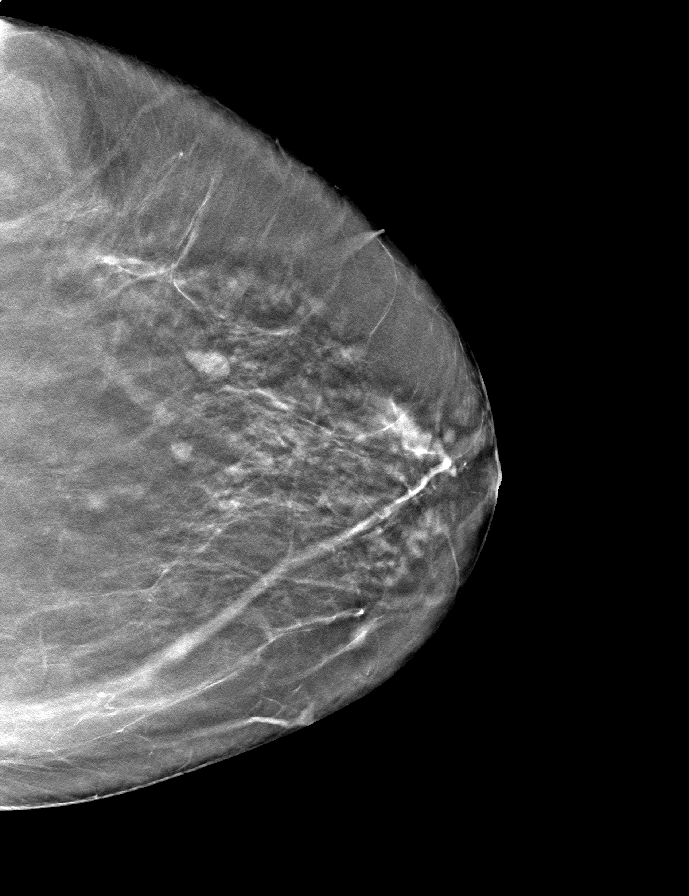

[R MLO tomo · tomo slice 36/71.0]
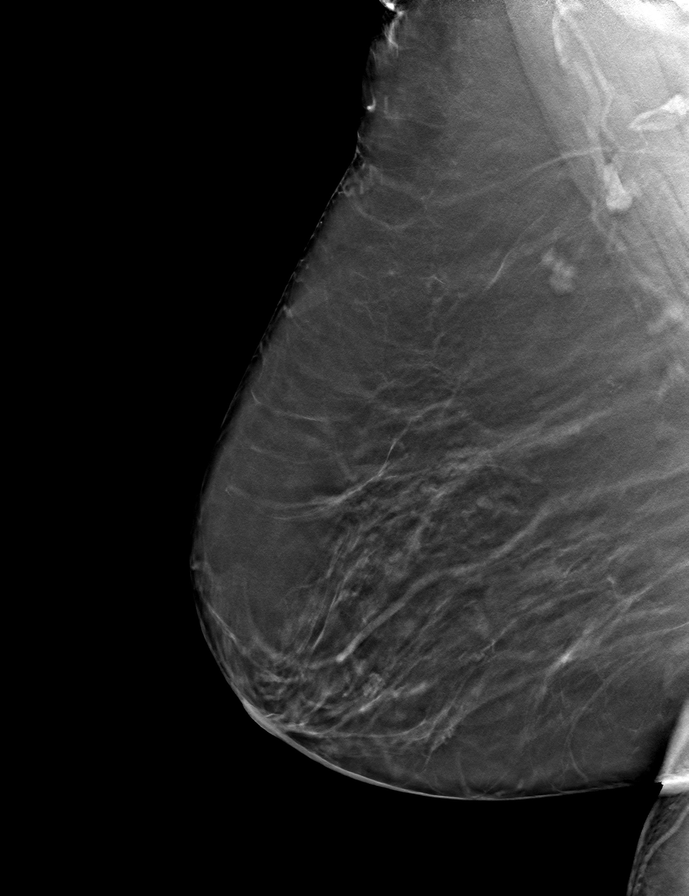

[L MLO tomo · tomo slice 35/68.0]
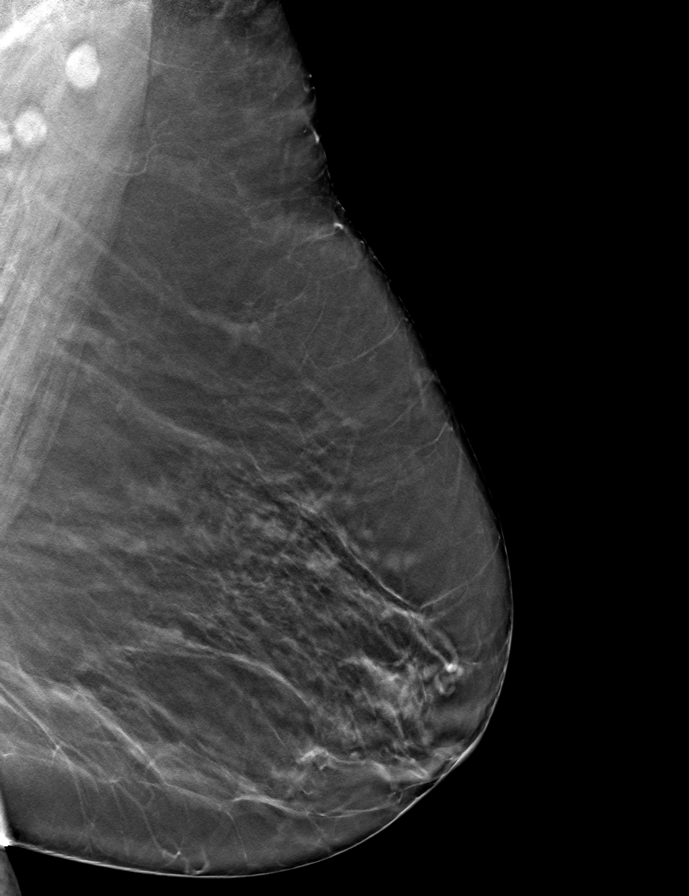

[9 of 24 positions shown; findings below may reference images not displayed]

ACR Breast Density Category b: There are scattered areas of
fibroglandular density.
FINDINGS: In the left breast, a possible mass in the upper outer quadrant in
the middle 3rd warrants further evaluation. In the right breast, no
findings suspicious for malignancy.

Images were processed with CAD.
IMPRESSION: Further evaluation is suggested for a possible mass in the left
breast.

RECOMMENDATION:
Diagnostic mammogram and possibly ultrasound of the left breast.
(Code:[id number])

The patient will be contacted regarding the findings, and additional
imaging will be scheduled.

BI-RADS CATEGORY  0: Incomplete. Need additional imaging evaluation
and/or prior mammograms for comparison.

## 2022-05-11 IMAGING — DX DG CHEST 2V
2 series · 2 of 2 positions shown · non-contrast
Comparison: 11/08/2020.

CLINICAL DATA: Pleural effusion.

EXAM:
CHEST - 2 VIEW

[chest pa]
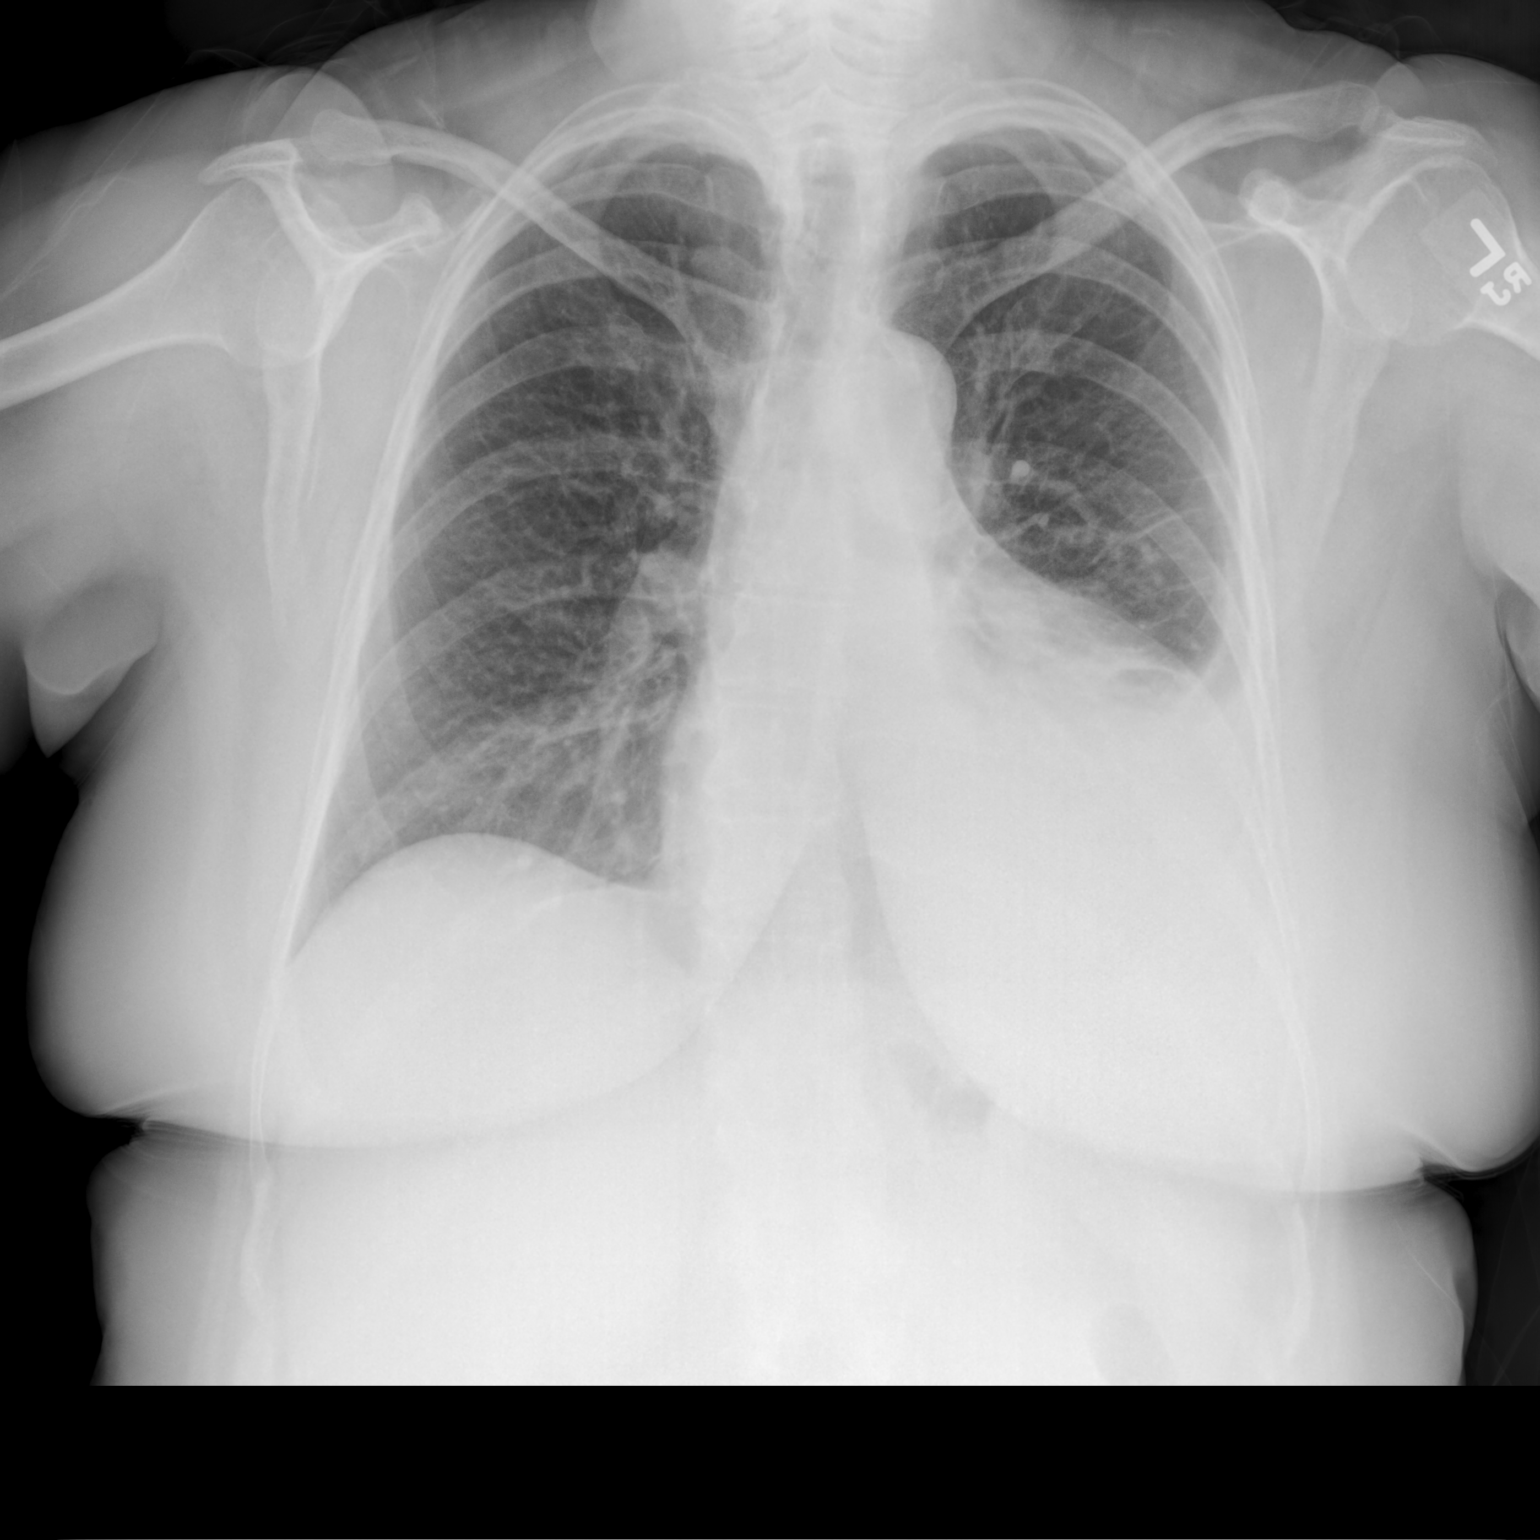

[chest lat]
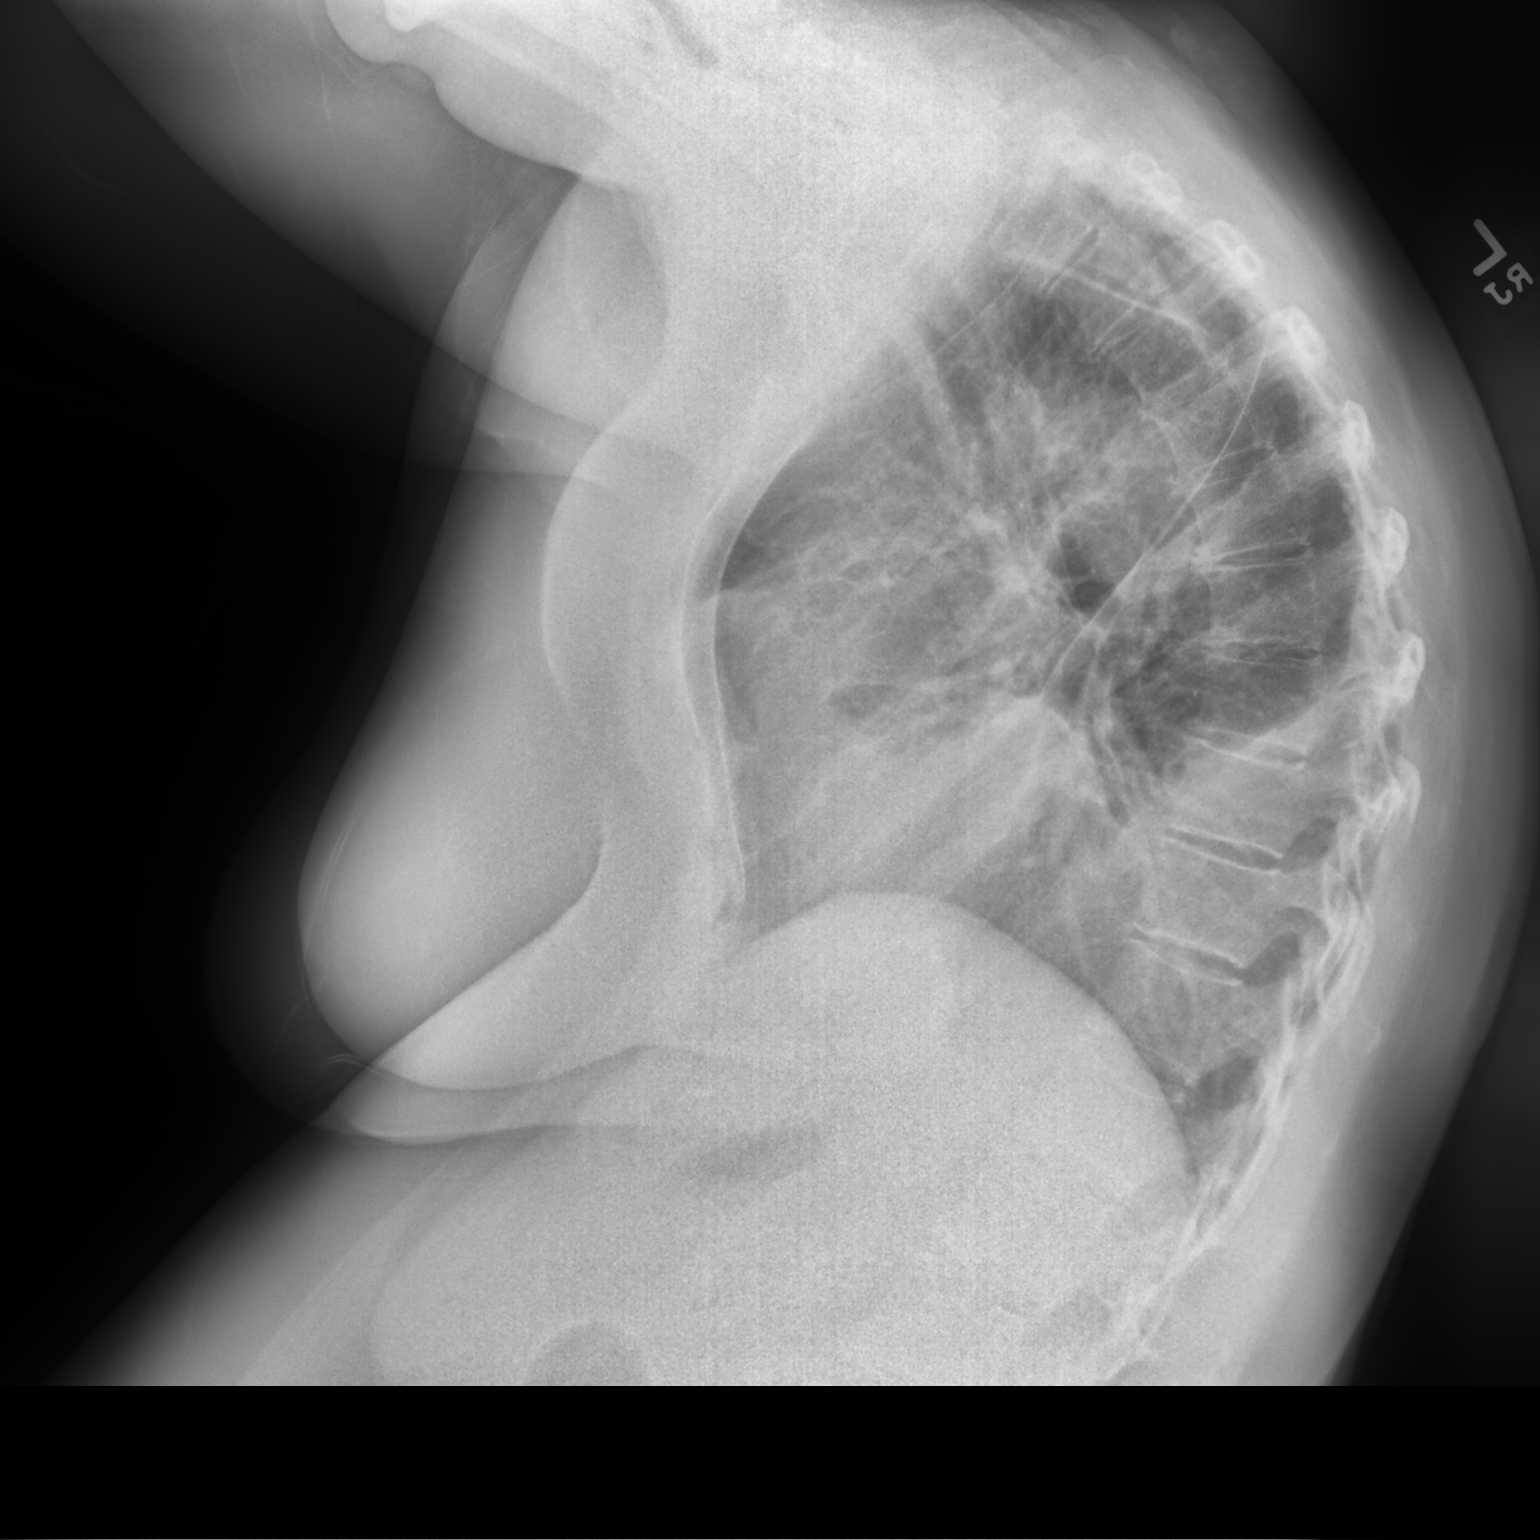

[2 of 2 positions shown; findings below may reference images not displayed]

FINDINGS: Pectus deformity. Stable cardiomegaly. Prominent left lower lobe
atelectasis/infiltrate. Slight improvement from prior exam.
Persistent prominent left-sided pleural effusion. No pneumothorax.
IMPRESSION: Prominent left lower lobe atelectasis/infiltrate with slight
improvement from prior exam. Persistent prominent left-sided pleural
effusion.

## 2022-07-21 ENCOUNTER — Encounter: Payer: Self-pay | Admitting: Cardiology

## 2022-07-21 ENCOUNTER — Ambulatory Visit: Payer: 59 | Attending: Cardiology | Admitting: Cardiology

## 2022-07-21 VITALS — BP 120/80 | HR 85 | Ht 65.0 in | Wt 235.0 lb

## 2022-07-21 DIAGNOSIS — R002 Palpitations: Secondary | ICD-10-CM

## 2022-07-21 DIAGNOSIS — E785 Hyperlipidemia, unspecified: Secondary | ICD-10-CM | POA: Diagnosis not present

## 2022-07-21 DIAGNOSIS — I10 Essential (primary) hypertension: Secondary | ICD-10-CM | POA: Diagnosis not present

## 2022-07-21 NOTE — Patient Instructions (Signed)
Medication Instructions:  The current medical regimen is effective;  continue present plan and medications.  *If you need a refill on your cardiac medications before your next appointment, please call your pharmacy*   Testing/Procedures: [employer] Monitor Instructions  Your physician has requested you wear a ZIO patch monitor for 14 days.  This is a single patch monitor. [employer] supplies one patch monitor per enrollment. Additional stickers are not available. Please do not apply patch if you will be having a Nuclear Stress Test,  Echocardiogram, Cardiac CT, MRI, or Chest Xray during the period you would be wearing the  monitor. The patch cannot be worn during these tests. You cannot remove and re-apply the  ZIO XT patch monitor.  Your ZIO patch monitor will be mailed 3 day USPS to your address on file. It may take 3-5 days  to receive your monitor after you have been enrolled.  Once you have received your monitor, please review the enclosed instructions. Your monitor  has already been registered assigning a specific monitor serial # to you.  Billing and Patient Assistance Program Information  We have supplied [employer] with any of your insurance information on file for billing purposes. [employer] offers a sliding scale Patient Assistance Program for patients that do not have  insurance, or whose insurance does not completely cover the cost of the ZIO monitor.  You must apply for the Patient Assistance Program to qualify for this discounted rate.  To apply, please call [employer] at [phone], select option 4, select option 2, ask to apply for  Patient Assistance Program. Theodore Demark will ask your household income, and how many people  are in your household. They will quote your out-of-pocket cost based on that information.  [employer] will also be able to set up a 58-month, interest-free payment plan if needed.  Applying the monitor   Shave hair from upper left chest.  Hold abrader  disc by orange tab. Rub abrader in 40 strokes over the upper left chest as  indicated in your monitor instructions.  Clean area with 4 enclosed alcohol pads. Let dry.  Apply patch as indicated in monitor instructions. Patch will be placed under collarbone on left  side of chest with arrow pointing upward.  Rub patch adhesive wings for 2 minutes. Remove white label marked "1". Remove the white  label marked "2". Rub patch adhesive wings for 2 additional minutes.  While looking in a mirror, press and release button in center of patch. A small green light will  flash 3-4 times. This will be your only indicator that the monitor has been turned on.  Do not shower for the first 24 hours. You may shower after the first 24 hours.  Press the button if you feel a symptom. You will hear a small click. Record Date, Time and  Symptom in the Patient Logbook.  When you are ready to remove the patch, follow instructions on the last 2 pages of Patient  Logbook. Stick patch monitor onto the last page of Patient Logbook.  Place Patient Logbook in the blue and white box. Use locking tab on box and tape box closed  securely. The blue and white box has prepaid postage on it. Please place it in the mailbox as  soon as possible. Your physician should have your test results approximately 7 days after the  monitor has been mailed back to [facility].  Call [facility] at [phone] if you have questions regarding  your ZIO XT  patch monitor. Call them immediately if you see an orange light blinking on your  monitor.  If your monitor falls off in less than 4 days, contact our Monitor department at [phone].  If your monitor becomes loose or falls off after 4 days call [employer] at [phone] for  suggestions on securing your monitor  Follow-Up: At [facility], you and your health needs are our priority.  As part of our continuing mission to provide you with exceptional heart  care, we have created designated Provider Care Teams.  These Care Teams include your primary Cardiologist (physician) and Advanced Practice Providers (APPs -  Physician Assistants and Nurse Practitioners) who all work together to provide you with the care you need, when you need it.  We recommend signing up for the patient portal called "MyChart".  Sign up information is provided on this After Visit Summary.  MyChart is used to connect with patients for Virtual Visits (Telemedicine).  Patients are able to view lab/test results, encounter notes, upcoming appointments, etc.  Non-urgent messages can be sent to your provider as well.   To learn more about what you can do with MyChart, go to [email or web address].    Your next appointment:   1 year(s)  The format for your next appointment:   In Person  Provider:   Dr Donato Schultz     Important Information About Sugar

## 2022-07-21 NOTE — Progress Notes (Signed)
Cardiology Office Note:    Date:  07/21/2022   ID:  Tamara Walton, DOB 11-Mar-1956, MRN [id number]  PCP:  [facility] Providers Cardiologist:  None     Referring MD: [facility]     History of Present Illness:    Tamara Walton is a 66 y.o. female here to evaluate palpitations, hypertension, and hyperlipidemia at the request of Dr. Reubin Milan.  Referral notes from Dr. Lucianne Muss personally reviewed. She was referred to cardiology for further evaluation of episodic heart palpitations.  Today: She states that her initial episode of palpitations occurred about a year ago. There were no recurring episodes until 3-4 weeks ago. During her episodes she typically feels a brief little flutter, followed by an elevated heart rate for a time. This causes her to feel anxious, and she may develop associated fatigue and headaches. Her episodes have usually occurred at night. Of note, she confirms that during both of her episodes she was feeling stressed.   In her family, her sister and grandfather have Afib. Her mother has tachycardia.  She does not smoke. No significant alcohol consumption.  She denies any chest pain, shortness of breath, or peripheral edema. No lightheadedness, syncope, orthopnea, or PND.    Past Medical History:  Diagnosis Date   Allergic rhinitis    Asthma    BMI 36.0-36.9,adult    Cervicalgia    Esophageal reflux    GERD (gastroesophageal reflux disease)    Hyperlipidemia    Hypertension    Night-waking disorder, delayed sleep phase type    Obesity    OSA (obstructive sleep apnea)     Past Surgical History:  Procedure Laterality Date   THORACENTESIS N/A 12/03/2020   Procedure: THORACENTESIS;  Surgeon: Chilton Greathouse, MD;  Location: [facility];  Service: Cardiopulmonary;  Laterality: N/A;    Current Medications: Current Meds  Medication Sig   acetaminophen (TYLENOL) 500 MG tablet Take  1,000 mg by mouth every 6 (six) hours as needed for headache (pain).   albuterol (VENTOLIN HFA) 108 (90 Base) MCG/ACT inhaler Inhale 2 puffs into the lungs every 6 (six) hours as needed for wheezing or shortness of breath.   amoxicillin-clavulanate (AUGMENTIN) 875-125 MG tablet Take 1 tablet by mouth 2 (two) times daily.   calcium carbonate (TUMS EX) 750 MG chewable tablet Chew 1 tablet by mouth 3 (three) times daily as needed for heartburn.   fluticasone (FLONASE) 50 MCG/ACT nasal spray Place 1 spray into both nostrils daily.   hydrochlorothiazide (HYDRODIURIL) 25 MG tablet Take 25 mg by mouth daily. Taking 12.5mg  daily   montelukast (SINGULAIR) 10 MG tablet Take 10 mg by mouth at bedtime.   olmesartan (BENICAR) 5 MG tablet Take 10 mg by mouth daily.   rosuvastatin (CRESTOR) 10 MG tablet Take 10 mg by mouth at bedtime.     Allergies:   Erythromycin, Levaquin [levofloxacin], Strawberry (diagnostic), Strawberry extract, Amoxicillin-pot clavulanate, and Lisinopril   Social History   Socioeconomic History   Marital status: Widowed    Spouse name: Not on file   Number of children: Not on file   Years of education: Not on file   Highest education level: Not on file  Occupational History   Not on file  Tobacco Use   Smoking status: Former    Packs/day: 1.00    Years: 25.00    Total pack years: 25.00    Types: Cigarettes   Smokeless tobacco: Never  Substance and Sexual Activity   Alcohol use: Never   Drug use: Not on file   Sexual activity: Not on file  Other Topics Concern   Not on file  Social History Narrative   Not on file   Social Determinants of Health   Financial Resource Strain: Not on file  Food Insecurity: Not on file  Transportation Needs: Not on file  Physical Activity: Not on file  Stress: Not on file  Social Connections: Not on file     Family History: The patient's family history includes Lung cancer (age of onset: 3151) in her mother; Lung cancer (age of  onset: 6867) in her father.  ROS:   Please see the history of present illness.    (+) Intermittent palpitations with associated fatigue/headaches (+) Anxiety  All other systems reviewed and are negative.  EKGs/Labs/Other Studies Reviewed:    The following studies were reviewed today:  CT Chest 02/16/2022: FINDINGS: Cardiovascular: Limited due to lack of IV contrast. Mild atherosclerotic changes are seen. No cardiac enlargement is noted. No aneurysmal dilatation is noted. No pericardial effusion is seen. No coronary calcifications are noted.   Mediastinum/Nodes: Heterogeneity of the thyroid gland is noted stable in appearance from prior exam. By history there is been a previous biopsy in 2002 with benign results. No sizable hilar or mediastinal adenopathy is noted. The esophagus is within normal limits.   Lungs/Pleura: Lungs are well aerated bilaterally. Mild emphysematous changes are seen. No focal infiltrate or sizable effusion is noted. Minimal scarring is noted in the left base stable from the previous exam. No parenchymal nodules are seen.   Upper Abdomen: No acute abnormality.   Musculoskeletal: No chest wall mass or suspicious bone lesions identified.   IMPRESSION: No sizable recurrent effusion is noted.   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 Previous biopsy was performed in 2002 on the left with benign results. The overall appearance of the thyroid is stable from November of 2021 CT and by report from prior ultrasound in 2002. Stability for greater than 5 years implies benignity; no biopsy or followup indicated (ref: J Am Coll Radiol. 2015 Feb;12(2): 143-50).   Aortic Atherosclerosis (ICD10-I70.0) and Emphysema (ICD10-J43.9).  ECHO 10/13/2020: IMPRESSIONS     1. Prominent epicardial fat tissue.   2. Left ventricular ejection fraction, by estimation, is 55 to 60%. The  left ventricle has normal function. The left ventricle has no regional  wall  motion abnormalities. Left ventricular diastolic parameters were  normal.   3. Right ventricular systolic function is normal. The right ventricular  size is normal.   4. The mitral valve is normal in structure. Trivial mitral valve  regurgitation. No evidence of mitral stenosis.   5. The aortic valve is normal in structure. Aortic valve regurgitation is  not visualized. No aortic stenosis is present.   6. The inferior vena cava is normal in size with greater than 50%  respiratory variability, suggesting right atrial pressure of 3 mmHg.   EKG:  EKG is personally reviewed.  07/21/2022: Sinus rhythm, 85 bpm, no other changes  Recent Labs: No results found for requested labs within last 365 days.  Recent Lipid Panel    Component Value Date/Time   CHOL 254 (H) 10/13/2020 0504   TRIG 205 (H) 10/13/2020 0504   HDL 29 (L) 10/13/2020 0504   CHOLHDL 8.8 10/13/2020 0504   VLDL 41 (H) 10/13/2020 0504   LDLCALC 184 (H) 10/13/2020 0504     Risk Assessment/Calculations:  Physical Exam:    VS:  BP 120/80 (BP Location: Left Arm, Patient Position: Sitting, Cuff Size: Normal)   Pulse 85   Ht 5\' 5"  (1.651 m)   Wt 235 lb (106.6 kg)   SpO2 96%   BMI 39.11 kg/m     Wt Readings from Last 3 Encounters:  07/21/22 235 lb (106.6 kg)  04/10/22 233 lb 12.8 oz (106.1 kg)  08/21/21 222 lb 3.2 oz (100.8 kg)     GEN: Well nourished, well developed in no acute distress HEENT: Normal NECK: No JVD; No carotid bruits LYMPHATICS: No lymphadenopathy CARDIAC: RRR, no murmurs, rubs, gallops RESPIRATORY:  Clear to auscultation without rales, wheezing or rhonchi  ABDOMEN: Soft, non-tender, non-distended MUSCULOSKELETAL:  No edema; No deformity  SKIN: Warm and dry NEUROLOGIC:  Alert and oriented x 3 PSYCHIATRIC:  Normal affect   ASSESSMENT:    1. Palpitations   2. Primary hypertension   3. Hyperlipidemia, unspecified hyperlipidemia type    PLAN:    In order of problems listed  above:  Palpitations She has family members with atrial fibrillation.  We will go ahead and check a Zio patch monitor for 2 weeks to see if we can discover any adverse arrhythmias.  If this is normal, she can always consider purchasing a Kardia device or potentially an Apple Watch type product to help detect any atrial fibrillation.  There algorithms are fairly reasonable.  Stress, caffeine can potentiate flutter like sensation.  Sometimes she states that they can last for 1 hour but sometimes only a few seconds.  No high risk symptoms such as syncope.  Hypertension Continue with olmesartan and hydrochlorothiazide.  No dosage medication changes.  Doing well.  Hyperlipidemia On Crestor.  No changes made.  No myalgias.        Follow Up:  1 year  Medication Adjustments/Labs and Tests Ordered: Current medicines are reviewed at length with the patient today.  Concerns regarding medicines are outlined above.  Orders Placed This Encounter  Procedures   LONG TERM MONITOR (3-14 DAYS)   EKG 12-Lead   No orders of the defined types were placed in this encounter.   Patient Instructions  Medication Instructions:  The current medical regimen is effective;  continue present plan and medications.  *If you need a refill on your cardiac medications before your next appointment, please call your pharmacy*   Testing/Procedures: ZIO XT- Long Term Monitor Instructions  Your physician has requested you wear a ZIO patch monitor for 14 days.  This is a single patch monitor. [employer] supplies one patch monitor per enrollment. Additional stickers are not available. Please do not apply patch if you will be having a Nuclear Stress Test,  Echocardiogram, Cardiac CT, MRI, or Chest Xray during the period you would be wearing the  monitor. The patch cannot be worn during these tests. You cannot remove and re-apply the  ZIO XT patch monitor.  Your ZIO patch monitor will be mailed 3 day USPS to your  address on file. It may take 3-5 days  to receive your monitor after you have been enrolled.  Once you have received your monitor, please review the enclosed instructions. Your monitor  has already been registered assigning a specific monitor serial # to you.  Billing and Patient Assistance Program Information  We have supplied [employer] with any of your insurance information on file for billing purposes. [employer] offers a sliding scale Patient Assistance Program for patients that do not have  insurance, or whose insurance  does not completely cover the cost of the ZIO monitor.  You must apply for the Patient Assistance Program to qualify for this discounted rate.  To apply, please call [employer] at [phone], select option 4, select option 2, ask to apply for  Patient Assistance Program. Meredeth Ide will ask your household income, and how many people  are in your household. They will quote your out-of-pocket cost based on that information.  [employer] will also be able to set up a 87-month, interest-free payment plan if needed.  Applying the monitor   Shave hair from upper left chest.  Hold abrader disc by orange tab. Rub abrader in 40 strokes over the upper left chest as  indicated in your monitor instructions.  Clean area with 4 enclosed alcohol pads. Let dry.  Apply patch as indicated in monitor instructions. Patch will be placed under collarbone on left  side of chest with arrow pointing upward.  Rub patch adhesive wings for 2 minutes. Remove white label marked "1". Remove the white  label marked "2". Rub patch adhesive wings for 2 additional minutes.  While looking in a mirror, press and release button in center of patch. A small green light will  flash 3-4 times. This will be your only indicator that the monitor has been turned on.  Do not shower for the first 24 hours. You may shower after the first 24 hours.  Press the button if you feel a symptom. You will hear a small click. Record  Date, Time and  Symptom in the Patient Logbook.  When you are ready to remove the patch, follow instructions on the last 2 pages of Patient  Logbook. Stick patch monitor onto the last page of Patient Logbook.  Place Patient Logbook in the blue and white box. Use locking tab on box and tape box closed  securely. The blue and white box has prepaid postage on it. Please place it in the mailbox as  soon as possible. Your physician should have your test results approximately 7 days after the  monitor has been mailed back to [facility].  Call [facility] Customer Care at [phone] if you have questions regarding  your ZIO XT patch monitor. Call them immediately if you see an orange light blinking on your  monitor.  If your monitor falls off in less than 4 days, contact our Monitor department at [phone].  If your monitor becomes loose or falls off after 4 days call [employer] at [phone] for  suggestions on securing your monitor  Follow-Up: At [facility], you and your health needs are our priority.  As part of our continuing mission to provide you with exceptional heart care, we have created designated Provider Care Teams.  These Care Teams include your primary Cardiologist (physician) and Advanced Practice Providers (APPs -  Physician Assistants and Nurse Practitioners) who all work together to provide you with the care you need, when you need it.  We recommend signing up for the patient portal called "MyChart".  Sign up information is provided on this After Visit Summary.  MyChart is used to connect with patients for Virtual Visits (Telemedicine).  Patients are able to view lab/test results, encounter notes, upcoming appointments, etc.  Non-urgent messages can be sent to your provider as well.   To learn more about what you can do with MyChart, go to [email or web address].    Your next appointment:   1 year(s)  The format for your next appointment:   In  Person  Provider:  Dr Donato Schultz     Important Information About Sugar          Jodelle Gross Ford,acting as a scribe for Donato Schultz, MD.,have documented all relevant documentation on the behalf of Donato Schultz, MD,as directed by  Donato Schultz, MD while in the presence of Donato Schultz, MD.   I, Donato Schultz, MD, have reviewed all documentation for this visit. The documentation on 07/21/22 for the exam, diagnosis, procedures, and orders are all accurate and complete.  Signed, Donato Schultz, MD  07/21/2022 4:30 PM    [facility]

## 2022-07-22 ENCOUNTER — Ambulatory Visit: Payer: 59 | Attending: Cardiology

## 2022-07-22 DIAGNOSIS — R002 Palpitations: Secondary | ICD-10-CM

## 2022-07-22 NOTE — Progress Notes (Unsigned)
Enrolled for [employer] to mail a ZIO XT long term holter monitor to the patients address on file.  

## 2022-07-24 DIAGNOSIS — R002 Palpitations: Secondary | ICD-10-CM | POA: Diagnosis not present

## 2022-11-10 ENCOUNTER — Encounter: Payer: Self-pay | Admitting: Cardiology

## 2022-11-10 NOTE — Telephone Encounter (Signed)
Error

## 2022-11-18 DIAGNOSIS — J069 Acute upper respiratory infection, unspecified: Secondary | ICD-10-CM | POA: Diagnosis not present

## 2022-11-18 DIAGNOSIS — Z03818 Encounter for observation for suspected exposure to other biological agents ruled out: Secondary | ICD-10-CM | POA: Diagnosis not present

## 2022-11-18 DIAGNOSIS — R059 Cough, unspecified: Secondary | ICD-10-CM | POA: Diagnosis not present

## 2022-11-18 DIAGNOSIS — B974 Respiratory syncytial virus as the cause of diseases classified elsewhere: Secondary | ICD-10-CM | POA: Diagnosis not present

## 2022-11-18 DIAGNOSIS — R6889 Other general symptoms and signs: Secondary | ICD-10-CM | POA: Diagnosis not present

## 2022-11-18 DIAGNOSIS — R0981 Nasal congestion: Secondary | ICD-10-CM | POA: Diagnosis not present

## 2022-12-08 DIAGNOSIS — I10 Essential (primary) hypertension: Secondary | ICD-10-CM | POA: Diagnosis not present

## 2022-12-08 DIAGNOSIS — G4733 Obstructive sleep apnea (adult) (pediatric): Secondary | ICD-10-CM | POA: Diagnosis not present

## 2022-12-08 DIAGNOSIS — Z79899 Other long term (current) drug therapy: Secondary | ICD-10-CM | POA: Diagnosis not present

## 2022-12-08 DIAGNOSIS — G4721 Circadian rhythm sleep disorder, delayed sleep phase type: Secondary | ICD-10-CM | POA: Diagnosis not present

## 2022-12-08 DIAGNOSIS — K219 Gastro-esophageal reflux disease without esophagitis: Secondary | ICD-10-CM | POA: Diagnosis not present

## 2022-12-08 DIAGNOSIS — Z6839 Body mass index (BMI) 39.0-39.9, adult: Secondary | ICD-10-CM | POA: Diagnosis not present

## 2022-12-08 DIAGNOSIS — E782 Mixed hyperlipidemia: Secondary | ICD-10-CM | POA: Diagnosis not present

## 2022-12-10 DIAGNOSIS — R0781 Pleurodynia: Secondary | ICD-10-CM | POA: Diagnosis not present

## 2022-12-10 DIAGNOSIS — R059 Cough, unspecified: Secondary | ICD-10-CM | POA: Diagnosis not present

## 2022-12-10 DIAGNOSIS — J40 Bronchitis, not specified as acute or chronic: Secondary | ICD-10-CM | POA: Diagnosis not present

## 2023-01-22 DIAGNOSIS — G4733 Obstructive sleep apnea (adult) (pediatric): Secondary | ICD-10-CM | POA: Diagnosis not present

## 2023-02-05 ENCOUNTER — Ambulatory Visit (INDEPENDENT_AMBULATORY_CARE_PROVIDER_SITE_OTHER): Payer: PPO

## 2023-02-05 ENCOUNTER — Encounter: Payer: Self-pay | Admitting: Primary Care

## 2023-02-05 ENCOUNTER — Ambulatory Visit (INDEPENDENT_AMBULATORY_CARE_PROVIDER_SITE_OTHER): Payer: PPO | Admitting: Primary Care

## 2023-02-05 VITALS — BP 132/84 | HR 73 | Temp 97.7°F | Ht 65.0 in | Wt 243.0 lb

## 2023-02-05 DIAGNOSIS — R059 Cough, unspecified: Secondary | ICD-10-CM | POA: Insufficient documentation

## 2023-02-05 DIAGNOSIS — R051 Acute cough: Secondary | ICD-10-CM | POA: Diagnosis not present

## 2023-02-05 NOTE — Progress Notes (Signed)
@Patient  ID: Tamara Walton, female    DOB: [birth year], 67 y.o.   MRN: [id number]  Chief Complaint  Patient presents with   Acute Visit    Cough     Referring provider: [facility] Jerilynn [facility]ages*  HPI: 67 year old with history of hypertension, asthma, hyperlipidemia, allergies  She was admitted on end of November 2021 with lung imaging showing moderate left effusion status post thoracentesis which showed exudative effusion with atypical mesothelial cells.  Negative cultures Noted to have recurrence of effusion and sent for repeat thoracentesis on November 08, 2020 by IR.  Unfortunately she did not have repeat pleural fluid studies performed.  She was treated with levofloxacin for a week at the end of December 2021 She underwent third thoracentesis on 11/23/2020 with small pneumothorax ex vacuo which resolved spontaneously Pleural studies show exudative effusion with negative cultures and cytology  Developed COVID-19 in early February 2022.  She had a mild case and did not require hospitalization.  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  No feather pillows or comforters Smoking history: 30-pack-year smoker.  Quit in 2008 Travel history: No significant travel history Relevant family history: Mother died of lung cancer.  She was a smoker.  66/19/23 Here for follow-up of pleural effusion. States that breathing is doing well with no dyspnea.  No cough, sputum production, fevers, chills She is here for review of CT and PFTs  Has sinus congestion, green nasal discharge for the past 1 week with sinus heaviness and postnasal drip.  02/05/2023 Patient presents today for acute visit/chest congestion. Hx pleural effusion in 2021. She had recent RSV infection in January 2024. She does not have a regular cough but when she does she feels her cough is sort of congested and has noticed some associated wheezing. She is concerned that  there may ben fluid in her lungs.  Denies fever, chest pain, shortness of breath.  Allergies  Allergen Reactions   Erythromycin Nausea And Vomiting   Levaquin [Levofloxacin]     Joint pain   Strawberry (Diagnostic) Hives   Strawberry Extract Hives   Amoxicillin-Pot Clavulanate Nausea And Vomiting and Nausea Only   Lisinopril Cough    Immunization History  Administered Date(s) Administered   Influenza Split 11/23/2009   Influenza, Seasonal, Injecte, Preservative Fre 09/17/2014   Influenza,inj,Quad PF,6+ Mos 10/05/2016, 10/08/2017, 09/27/2018   Influenza,trivalent, recombinat, inj, PF 11/23/2009   Influenza-Unspecified 07/21/2012, 09/17/2014   PFIZER(Purple Top)SARS-COV-2 Vaccination 03/13/2020, 04/03/2020   Pneumococcal Polysaccharide-23 11/23/2008   Tdap 10/08/2017   Unspecified SARS-COV-2 Vaccination 03/13/2010, 04/03/2020    Past Medical History:  Diagnosis Date   Allergic rhinitis    Asthma    BMI 36.0-36.9,adult    Cervicalgia    Esophageal reflux    GERD (gastroesophageal reflux disease)    Hyperlipidemia    Hypertension    Night-waking disorder, delayed sleep phase type    Obesity    OSA (obstructive sleep apnea)     Tobacco History: Social History   Tobacco Use  Smoking Status Former   Packs/day: 1.00   Years: 25.00   Additional pack years: 0.00   Total pack years: 25.00   Types: Cigarettes  Smokeless Tobacco Never   Counseling given: Not Answered   Outpatient Medications Prior to Visit  Medication Sig Dispense Refill   acetaminophen (TYLENOL) 500 MG tablet Take 1,000 mg by mouth every 6 (six) hours as needed for headache (pain).     albuterol (  VENTOLIN HFA) 108 (90 Base) MCG/ACT inhaler Inhale 2 puffs into the lungs every 6 (six) hours as needed for wheezing or shortness of breath.     calcium carbonate (TUMS EX) 750 MG chewable tablet Chew 1 tablet by mouth 3 (three) times daily as needed for heartburn.     fluticasone (FLONASE) 50 MCG/ACT nasal  spray Place 1 spray into both nostrils daily.     hydrochlorothiazide (HYDRODIURIL) 25 MG tablet Take 25 mg by mouth daily. Taking 12.5mg  daily     montelukast (SINGULAIR) 10 MG tablet Take 10 mg by mouth at bedtime.     olmesartan (BENICAR) 5 MG tablet Take 10 mg by mouth daily.     rosuvastatin (CRESTOR) 10 MG tablet Take 10 mg by mouth at bedtime.     amoxicillin-clavulanate (AUGMENTIN) 875-125 MG tablet Take 1 tablet by mouth 2 (two) times daily. (Patient not taking: Reported on 02/05/2023) 14 tablet 0   No facility-administered medications prior to visit.    Review of Systems  Review of Systems  Constitutional: Negative.   HENT: Negative.    Respiratory:  Positive for cough and wheezing. Negative for shortness of breath.   Cardiovascular: Negative.    Physical Exam  BP 132/84 (BP Location: Left Arm, Patient Position: Sitting, Cuff Size: Large)   Pulse 73   Temp 97.7 F (36.5 C) (Oral)   Ht 5\' 5"  (1.651 m)   Wt 243 lb (110.2 kg)   SpO2 97%   BMI 40.44 kg/m  Physical Exam Constitutional:      General: She is not in acute distress.    Appearance: Normal appearance. She is not ill-appearing.  HENT:     Head: Normocephalic and atraumatic.  Cardiovascular:     Rate and Rhythm: Normal rate and regular rhythm.  Pulmonary:     Effort: Pulmonary effort is normal.     Breath sounds: Normal breath sounds.     Comments: CTA, slightly diminished right base Musculoskeletal:        General: Normal range of motion.  Skin:    General: Skin is warm and dry.  Neurological:     General: No focal deficit present.     Mental Status: She is alert and oriented to person, place, and time. Mental status is at baseline.  Psychiatric:        Mood and Affect: Mood normal.        Behavior: Behavior normal.        Thought Content: Thought content normal.        Judgment: Judgment normal.      Lab Results:  CBC    Component Value Date/Time   WBC 9.3 10/13/2020 0504   RBC 4.22  10/13/2020 0504   HGB 11.4 (L) 10/13/2020 0504   HCT 35.4 (L) 10/13/2020 0504   PLT 468 (H) 10/13/2020 0504   MCV 83.9 10/13/2020 0504   MCH 27.0 10/13/2020 0504   MCHC 32.2 10/13/2020 0504   RDW 14.2 10/13/2020 0504   LYMPHSABS 1.7 08/05/2009 1815   MONOABS 0.9 08/05/2009 1815   EOSABS 0.0 08/05/2009 1815   BASOSABS 0.2 (H) 08/05/2009 1815    BMET    Component Value Date/Time   NA 135 10/13/2020 1132   K 3.9 10/13/2020 1132   CL 98 10/13/2020 1132   CO2 27 10/13/2020 1132   GLUCOSE 105 (H) 10/13/2020 1132   BUN 14 10/13/2020 1132   CREATININE 0.88 10/13/2020 1132   CALCIUM 9.0 10/13/2020 1132   GFRNONAA >60  10/13/2020 1132   GFRAA >60 07/28/2020 0026    BNP No results found for: "BNP"  ProBNP No results found for: "PROBNP"  Imaging: DG Chest 2 View  Result Date: 02/05/2023 CLINICAL DATA:  Provided history: Acute cough. History of pleural effusion. EXAM: CHEST - 2 VIEW COMPARISON:  Chest radiographs 12/10/2022 and earlier. Chest CT 02/16/2022. FINDINGS: Heart size within normal limits. No appreciable airspace consolidation. No evidence of pleural effusion or pneumothorax. No acute osseous abnormality identified. Dextrocurvature of the thoracic spine. IMPRESSION: No evidence of active cardiopulmonary disease. Electronically Signed   By: Kellie Simmering D.O.   On: 02/05/2023 16:22     Assessment & Plan:   Cough - Patient had RSV in January. Hx exudative pleural effusion. She has an occasional dry cough with associated wheeze. Overall breathing has been stable. Exam reassuring. CXR today without acute process, no evidence of pneumonia or pleural effusion. Reassurance provided. Fu if cough worsens.    Martyn Ehrich, NP 02/05/2023

## 2023-02-05 NOTE — Assessment & Plan Note (Addendum)
-   Patient had RSV in January. Hx exudative pleural effusion. She has an occasional dry cough with associated wheeze. Overall breathing has been stable. Exam reassuring. CXR today without acute process, no evidence of pneumonia or pleural effusion. Reassurance provided. Fu if cough worsens.

## 2023-02-05 NOTE — Patient Instructions (Signed)
Exam today is reassuring Will check chest x-ray to ensure you do not have pneumonia or pleural effusion Follow-up as needed

## 2023-02-24 DIAGNOSIS — G4733 Obstructive sleep apnea (adult) (pediatric): Secondary | ICD-10-CM | POA: Diagnosis not present

## 2023-03-26 DIAGNOSIS — G4733 Obstructive sleep apnea (adult) (pediatric): Secondary | ICD-10-CM | POA: Diagnosis not present

## 2023-04-16 NOTE — Telephone Encounter (Signed)
Made in error

## 2023-04-26 DIAGNOSIS — G4733 Obstructive sleep apnea (adult) (pediatric): Secondary | ICD-10-CM | POA: Diagnosis not present

## 2023-04-28 DIAGNOSIS — G4733 Obstructive sleep apnea (adult) (pediatric): Secondary | ICD-10-CM | POA: Diagnosis not present

## 2023-05-26 DIAGNOSIS — G4733 Obstructive sleep apnea (adult) (pediatric): Secondary | ICD-10-CM | POA: Diagnosis not present

## 2023-06-04 ENCOUNTER — Other Ambulatory Visit: Payer: Self-pay | Admitting: Family Medicine

## 2023-06-04 DIAGNOSIS — Z1231 Encounter for screening mammogram for malignant neoplasm of breast: Secondary | ICD-10-CM

## 2023-06-15 DIAGNOSIS — N390 Urinary tract infection, site not specified: Secondary | ICD-10-CM | POA: Diagnosis not present

## 2023-06-26 DIAGNOSIS — G4733 Obstructive sleep apnea (adult) (pediatric): Secondary | ICD-10-CM | POA: Diagnosis not present

## 2023-06-30 DIAGNOSIS — G4733 Obstructive sleep apnea (adult) (pediatric): Secondary | ICD-10-CM | POA: Diagnosis not present

## 2023-07-03 DIAGNOSIS — N390 Urinary tract infection, site not specified: Secondary | ICD-10-CM | POA: Diagnosis not present

## 2023-07-07 DIAGNOSIS — Z79899 Other long term (current) drug therapy: Secondary | ICD-10-CM | POA: Diagnosis not present

## 2023-07-07 DIAGNOSIS — R350 Frequency of micturition: Secondary | ICD-10-CM | POA: Diagnosis not present

## 2023-07-07 DIAGNOSIS — I10 Essential (primary) hypertension: Secondary | ICD-10-CM | POA: Diagnosis not present

## 2023-07-07 DIAGNOSIS — Z9989 Dependence on other enabling machines and devices: Secondary | ICD-10-CM | POA: Diagnosis not present

## 2023-07-07 DIAGNOSIS — G4733 Obstructive sleep apnea (adult) (pediatric): Secondary | ICD-10-CM | POA: Diagnosis not present

## 2023-07-07 DIAGNOSIS — Z6841 Body Mass Index (BMI) 40.0 and over, adult: Secondary | ICD-10-CM | POA: Diagnosis not present

## 2023-07-07 DIAGNOSIS — E782 Mixed hyperlipidemia: Secondary | ICD-10-CM | POA: Diagnosis not present

## 2023-07-08 DIAGNOSIS — I10 Essential (primary) hypertension: Secondary | ICD-10-CM | POA: Diagnosis not present

## 2023-07-08 DIAGNOSIS — E782 Mixed hyperlipidemia: Secondary | ICD-10-CM | POA: Diagnosis not present

## 2023-07-14 ENCOUNTER — Ambulatory Visit: Payer: PPO

## 2023-07-22 ENCOUNTER — Ambulatory Visit
Admission: RE | Admit: 2023-07-22 | Discharge: 2023-07-22 | Disposition: A | Discharge status: Home / Self Care | Payer: PPO | Source: Ambulatory Visit | Attending: Family Medicine | Admitting: Family Medicine

## 2023-07-22 DIAGNOSIS — Z1231 Encounter for screening mammogram for malignant neoplasm of breast: Secondary | ICD-10-CM

## 2023-07-27 DIAGNOSIS — G4733 Obstructive sleep apnea (adult) (pediatric): Secondary | ICD-10-CM | POA: Diagnosis not present

## 2023-08-25 DIAGNOSIS — Z03818 Encounter for observation for suspected exposure to other biological agents ruled out: Secondary | ICD-10-CM | POA: Diagnosis not present

## 2023-08-25 DIAGNOSIS — J019 Acute sinusitis, unspecified: Secondary | ICD-10-CM | POA: Diagnosis not present

## 2023-08-26 DIAGNOSIS — G4733 Obstructive sleep apnea (adult) (pediatric): Secondary | ICD-10-CM | POA: Diagnosis not present

## 2023-09-06 DIAGNOSIS — R399 Unspecified symptoms and signs involving the genitourinary system: Secondary | ICD-10-CM | POA: Diagnosis not present

## 2023-09-06 DIAGNOSIS — R1030 Lower abdominal pain, unspecified: Secondary | ICD-10-CM | POA: Diagnosis not present

## 2023-09-16 DIAGNOSIS — J4520 Mild intermittent asthma, uncomplicated: Secondary | ICD-10-CM | POA: Diagnosis not present

## 2023-09-16 DIAGNOSIS — E782 Mixed hyperlipidemia: Secondary | ICD-10-CM | POA: Diagnosis not present

## 2023-09-16 DIAGNOSIS — Z0000 Encounter for general adult medical examination without abnormal findings: Secondary | ICD-10-CM | POA: Diagnosis not present

## 2023-09-16 DIAGNOSIS — I10 Essential (primary) hypertension: Secondary | ICD-10-CM | POA: Diagnosis not present

## 2023-09-16 DIAGNOSIS — Z6841 Body Mass Index (BMI) 40.0 and over, adult: Secondary | ICD-10-CM | POA: Diagnosis not present

## 2023-09-16 DIAGNOSIS — Z79899 Other long term (current) drug therapy: Secondary | ICD-10-CM | POA: Diagnosis not present

## 2023-09-16 DIAGNOSIS — Z23 Encounter for immunization: Secondary | ICD-10-CM | POA: Diagnosis not present

## 2023-09-16 DIAGNOSIS — E2839 Other primary ovarian failure: Secondary | ICD-10-CM | POA: Diagnosis not present

## 2023-09-17 ENCOUNTER — Other Ambulatory Visit: Payer: Self-pay | Admitting: Family Medicine

## 2023-09-17 DIAGNOSIS — E2839 Other primary ovarian failure: Secondary | ICD-10-CM

## 2023-09-26 DIAGNOSIS — G4733 Obstructive sleep apnea (adult) (pediatric): Secondary | ICD-10-CM | POA: Diagnosis not present

## 2023-09-29 DIAGNOSIS — R399 Unspecified symptoms and signs involving the genitourinary system: Secondary | ICD-10-CM | POA: Diagnosis not present

## 2023-10-26 DIAGNOSIS — G4733 Obstructive sleep apnea (adult) (pediatric): Secondary | ICD-10-CM | POA: Diagnosis not present

## 2023-10-28 ENCOUNTER — Inpatient Hospital Stay
Admission: EM | Admit: 2023-10-28 | Discharge: 2023-11-01 | Disposition: A | Discharge status: Home / Self Care | Payer: MEDICARE | Admitting: Internal Medicine

## 2023-10-28 ENCOUNTER — Emergency Department: Admit: 2023-10-28 | Payer: MEDICARE

## 2023-10-28 ENCOUNTER — Inpatient Hospital Stay: Admit: 2023-10-29 | Payer: MEDICARE

## 2023-10-28 DIAGNOSIS — N23 Unspecified renal colic: Secondary | ICD-10-CM

## 2023-10-28 DIAGNOSIS — N200 Calculus of kidney: Secondary | ICD-10-CM

## 2023-10-28 DIAGNOSIS — A419 Sepsis, unspecified organism: Secondary | ICD-10-CM

## 2023-10-28 LAB — URINALYSIS WITH REFLEX TO CULTURE
Bilirubin, Urine: NEGATIVE
Glucose, Ur: NEGATIVE mg/dL
Nitrite, Urine: NEGATIVE
Protein, UA: 300 mg/dL — AB
Specific Gravity, UA: 1.022
Urobilinogen, Urine: 1 U/dL (ref 0.2–1.0)
WBC, UA: >100 /[HPF] — ABNORMAL HIGH (ref 0–4)
pH, Urine: 7.5 (ref 5.0–8.0)

## 2023-10-28 LAB — EKG 12-LEAD
Atrial Rate: 113 {beats}/min
P Axis: 83 degrees
P-R Interval: 122 ms
Q-T Interval: 302 ms
QRS Duration: 92 ms
QTc Calculation (Bazett): 414 ms
R Axis: 93 degrees
T Axis: 43 degrees
Ventricular Rate: 113 {beats}/min

## 2023-10-28 LAB — COMPREHENSIVE METABOLIC PANEL
ALT: 17 U/L (ref 12–78)
AST: 28 U/L (ref 15–37)
Albumin/Globulin Ratio: 0.6 — ABNORMAL LOW (ref 1.1–2.2)
Albumin: 2.8 g/dL — ABNORMAL LOW (ref 3.5–5.0)
Alk Phosphatase: 70 U/L (ref 45–117)
Anion Gap: 5 mmol/L (ref 2–12)
BUN/Creatinine Ratio: 7 — ABNORMAL LOW (ref 12–20)
BUN: 18 mg/dL (ref 6–20)
CO2: 26 mmol/L (ref 21–32)
Calcium: 9.3 mg/dL (ref 8.5–10.1)
Chloride: 106 mmol/L (ref 97–108)
Creatinine: 2.66 mg/dL — ABNORMAL HIGH (ref 0.55–1.02)
Est, Glom Filt Rate: 19 mL/min/{1.73_m2} — ABNORMAL LOW (ref >60)
Globulin: 4.4 g/dL — ABNORMAL HIGH (ref 2.0–4.0)
Glucose: 140 mg/dL — ABNORMAL HIGH (ref 65–100)
Potassium: 3.6 mmol/L (ref 3.5–5.1)
Sodium: 137 mmol/L (ref 136–145)
Total Bilirubin: 1 mg/dL (ref 0.2–1.0)
Total Protein: 7.2 g/dL (ref 6.4–8.2)

## 2023-10-28 LAB — CBC WITH AUTO DIFFERENTIAL
Basophils %: 0 % (ref 0–1)
Basophils Absolute: 0 10*3/uL (ref 0.0–0.1)
Eosinophils %: 0 % (ref 0–7)
Eosinophils Absolute: 0 10*3/uL (ref 0.0–0.4)
Hematocrit: 39.8 % (ref 35.0–47.0)
Hemoglobin: 12.6 g/dL (ref 11.5–16.0)
Immature Granulocytes %: 1 % — ABNORMAL HIGH (ref 0.0–0.5)
Immature Granulocytes Absolute: 0.2 10*3/uL — ABNORMAL HIGH (ref 0.00–0.04)
Lymphocytes %: 2 % — ABNORMAL LOW (ref 12–49)
Lymphocytes Absolute: 0.5 10*3/uL — ABNORMAL LOW (ref 0.8–3.5)
MCH: 25.8 pg — ABNORMAL LOW (ref 26.0–34.0)
MCHC: 31.7 g/dL (ref 30.0–36.5)
MCV: 81.4 fL (ref 80.0–99.0)
MPV: 9.7 fL (ref 8.9–12.9)
Monocytes %: 4 % — ABNORMAL LOW (ref 5–13)
Monocytes Absolute: 0.9 10*3/uL (ref 0.0–1.0)
Neutrophils %: 93 % — ABNORMAL HIGH (ref 32–75)
Neutrophils Absolute: 21.3 10*3/uL — ABNORMAL HIGH (ref 1.8–8.0)
Nucleated RBCs: 0 /100{WBCs}
Platelets: 271 10*3/uL (ref 150–400)
RBC: 4.89 M/uL (ref 3.80–5.20)
RDW: 17.2 % — ABNORMAL HIGH (ref 11.5–14.5)
WBC: 22.9 10*3/uL — ABNORMAL HIGH (ref 3.6–11.0)
nRBC: 0 10*3/uL (ref 0.00–0.01)

## 2023-10-28 LAB — COVID-19 & INFLUENZA COMBO
Rapid Influenza A By PCR: NOT DETECTED
Rapid Influenza B By PCR: NOT DETECTED
SARS-CoV-2, PCR: NOT DETECTED

## 2023-10-28 LAB — POC LACTIC ACID: POC Lactic Acid: 1.83 mmol/L (ref 0.40–2.00)

## 2023-10-28 LAB — EXTRA TUBES HOLD

## 2023-10-28 LAB — LIPASE: Lipase: 21 U/L (ref 13–75)

## 2023-10-28 MED ORDER — ONDANSETRON HCL 4 MG/2ML IJ SOLN
4 | Freq: Four times a day (QID) | INTRAMUSCULAR | Status: AC | PRN
Start: 2023-10-28 — End: ?

## 2023-10-28 MED ORDER — SODIUM CHLORIDE 0.9 % IV SOLN
0.9 | INTRAVENOUS | Status: DC
Start: 2023-10-28 — End: 2023-10-29
  Administered 2023-10-28: 21:00:00 via INTRAVENOUS

## 2023-10-28 MED ORDER — ACETAMINOPHEN 325 MG PO TABS
325 | Freq: Four times a day (QID) | ORAL | Status: DC | PRN
Start: 2023-10-28 — End: 2023-10-28

## 2023-10-28 MED ORDER — POLYETHYLENE GLYCOL 3350 17 G PO PACK
17 | Freq: Every day | ORAL | Status: AC | PRN
Start: 2023-10-28 — End: ?
  Administered 2023-10-30 – 2023-10-31 (×2): 17 g via ORAL

## 2023-10-28 MED ORDER — ASPIRIN 81 MG PO CHEW
81 | Freq: Every day | ORAL | Status: AC
Start: 2023-10-28 — End: ?
  Administered 2023-10-29 – 2023-11-01 (×4): 81 mg via ORAL

## 2023-10-28 MED ORDER — ONDANSETRON HCL 4 MG/2ML IJ SOLN
4 | Freq: Four times a day (QID) | INTRAMUSCULAR | Status: DC | PRN
Start: 2023-10-28 — End: 2023-10-28

## 2023-10-28 MED ORDER — HYDROMORPHONE HCL PF 1 MG/ML IJ SOLN
1 | INTRAMUSCULAR | Status: AC | PRN
Start: 2023-10-28 — End: ?
  Administered 2023-10-28 – 2023-10-29 (×4): 1 mg via INTRAVENOUS

## 2023-10-28 MED ORDER — STERILE WATER FOR INJECTION (MIXTURES ONLY)
2 g | INTRAMUSCULAR | Status: DC
Start: 2023-10-28 — End: 2023-10-28

## 2023-10-28 MED ORDER — NICOTINE 21 MG/24HR TD PT24
21 | Freq: Every day | TRANSDERMAL | Status: DC
Start: 2023-10-28 — End: 2023-11-01
  Administered 2023-10-28 – 2023-11-01 (×5): 1 via TRANSDERMAL

## 2023-10-28 MED ORDER — ACETAMINOPHEN 500 MG PO TABS
500 | ORAL | Status: DC
Start: 2023-10-28 — End: 2023-10-28
  Administered 2023-10-28: 17:00:00 1000 mg via ORAL

## 2023-10-28 MED ORDER — CEFTRIAXONE SODIUM 1 G IJ SOLR
1 | INTRAMUSCULAR | Status: DC
Start: 2023-10-28 — End: 2023-10-29

## 2023-10-28 MED ORDER — HYDROMORPHONE HCL PF 1 MG/ML IJ SOLN
1 | INTRAMUSCULAR | Status: DC | PRN
Start: 2023-10-28 — End: 2023-10-29

## 2023-10-28 MED ORDER — NICOTINE POLACRILEX 2 MG MT LOZG
2 | OROMUCOSAL | Status: DC | PRN
Start: 2023-10-28 — End: 2023-11-01

## 2023-10-28 MED ORDER — SODIUM CHLORIDE 0.9 % IV BOLUS
0.9 | Freq: Once | INTRAVENOUS | Status: AC
Start: 2023-10-28 — End: 2023-10-28
  Administered 2023-10-28: 17:00:00 1000 mL via INTRAVENOUS

## 2023-10-28 MED ORDER — ONDANSETRON 4 MG PO TBDP
4 | Freq: Three times a day (TID) | ORAL | Status: AC | PRN
Start: 2023-10-28 — End: ?

## 2023-10-28 MED ORDER — IOPAMIDOL 76 % IV SOLN
76 | Freq: Once | INTRAVENOUS | Status: DC | PRN
Start: 2023-10-28 — End: 2023-10-28

## 2023-10-28 MED ORDER — ACETAMINOPHEN 650 MG RE SUPP
650 | Freq: Four times a day (QID) | RECTAL | Status: DC | PRN
Start: 2023-10-28 — End: 2023-11-01

## 2023-10-28 MED ORDER — STERILE WATER FOR INJECTION (MIXTURES ONLY)
2 | Freq: Once | INTRAMUSCULAR | Status: AC
Start: 2023-10-28 — End: 2023-10-28
  Administered 2023-10-28: 19:00:00 2000 mg via INTRAVENOUS

## 2023-10-28 MED ORDER — IPRATROPIUM-ALBUTEROL 0.5-2.5 (3) MG/3ML IN SOLN
RESPIRATORY_TRACT | Status: AC | PRN
Start: 2023-10-28 — End: ?
  Administered 2023-10-28: 1 via RESPIRATORY_TRACT

## 2023-10-28 MED ORDER — SODIUM CHLORIDE 0.9 % IV SOLN
0.9 | INTRAVENOUS | Status: AC | PRN
Start: 2023-10-28 — End: ?

## 2023-10-28 MED ORDER — ACETAMINOPHEN 325 MG PO TABS
325 | Freq: Four times a day (QID) | ORAL | Status: DC | PRN
Start: 2023-10-28 — End: 2023-11-01
  Administered 2023-10-28 – 2023-11-01 (×5): 650 mg via ORAL

## 2023-10-28 MED ORDER — NORMAL SALINE FLUSH 0.9 % IV SOLN
0.9 | Freq: Two times a day (BID) | INTRAVENOUS | Status: AC
Start: 2023-10-28 — End: ?
  Administered 2023-10-29 – 2023-11-01 (×7): 10 mL via INTRAVENOUS

## 2023-10-28 MED ORDER — NORMAL SALINE FLUSH 0.9 % IV SOLN
0.9 | INTRAVENOUS | Status: AC | PRN
Start: 2023-10-28 — End: ?

## 2023-10-28 MED ORDER — IPRATROPIUM-ALBUTEROL 0.5-2.5 (3) MG/3ML IN SOLN
RESPIRATORY_TRACT | Status: DC
Start: 2023-10-28 — End: 2023-10-28
  Administered 2023-10-28: 17:00:00 1 via RESPIRATORY_TRACT

## 2023-10-28 MED FILL — ACETAMINOPHEN 325 MG PO TABS: 325 MG | ORAL | Qty: 2

## 2023-10-28 MED FILL — ACETAMINOPHEN 500 MG PO TABS: 500 MG | ORAL | Qty: 2

## 2023-10-28 MED FILL — NICOTINE 21 MG/24HR TD PT24: 21 MG/24HR | TRANSDERMAL | Qty: 1

## 2023-10-28 MED FILL — CEFTRIAXONE SODIUM 2 G IJ SOLR: 2 g | INTRAMUSCULAR | Qty: 2000

## 2023-10-28 MED FILL — IPRATROPIUM-ALBUTEROL 0.5-2.5 (3) MG/3ML IN SOLN: RESPIRATORY_TRACT | Qty: 3

## 2023-10-28 MED FILL — DILAUDID 1 MG/ML IJ SOLN: 1 MG/ML | INTRAMUSCULAR | Qty: 1

## 2023-10-28 NOTE — ED Provider Notes (Signed)
 [facility] EM[facility]Y DEPT  EMERGENCY DEPARTMENT ENCOUNTER       Pt Name: Tara Hogan  MRN: [id number]  Birthdate Sep 13, 1956  Date of evaluation: 10/28/2023  Provider: Rebeca Allegra, MD   PCP: No primary care provider on file.  Note Started: 1:25 PM EST 12/

## 2023-10-28 NOTE — ED Notes (Signed)
 Patient does not want pain medication at this time, patient advised to ring the call bell if she changes her mind or pain increases

## 2023-10-28 NOTE — ED Notes (Signed)
 RN to call back for report

## 2023-10-28 NOTE — Progress Notes (Signed)
 Called to obtain report, no answer.

## 2023-10-28 NOTE — Progress Notes (Signed)
 End of Shift Note    Bedside shift change report given to Fleet Contras, Charity fundraiser (oncoming nurse) by Deberah Pelton, LPN (offgoing nurse).  Report included the following information SBAR, Intake/Output, MAR, and Recent Results    Shift worked:  3-p-7p     Shift sum[facility]

## 2023-10-28 NOTE — ED Notes (Signed)
 EDT bedside obtaining IV and blood work

## 2023-10-28 NOTE — ED Notes (Signed)
 Respiratory called for breathing treatment.

## 2023-10-28 NOTE — H&P (Addendum)
 Hospitalist Admission Note    NAME:   Tara Hogan   DOB: 1956/02/10   MRN: [id number]     Date/Time: 10/28/2023 3:47 PM    Patient PCP: No primary care provider on file.    ______________________________________________________________________  Given

## 2023-10-28 NOTE — ED Notes (Signed)
Patient ambulatory to the bathroom at this time.

## 2023-10-28 NOTE — Anesthesia Pre-Procedure Evaluation (Addendum)
 Department of Anesthesiology  Preprocedure Note       Name:  Tara Hogan   Age:  67 y.o.  DOB:  07-Jul-1956                                          MRN:  [id number]         Date:  10/28/2023      Surgeon: Moishe Spice):  Rolland Porter, MD    Procedure: Proc

## 2023-10-28 NOTE — ED Notes (Signed)
 Waiting for pharmacy to verify abx

## 2023-10-28 NOTE — Op Note (Signed)
 PATIENT NAME: Tara Hogan     DATE: 10/28/23     PROCEDURE PERFORMED:      ANESTHESIA: General     SPECIMENS: None    INDICATIONS:   67 y.o woman with infected / obstructing 3mm L-UVJ stone, being taken urgently to OR for L-ureteral stent placement.

## 2023-10-28 NOTE — Anesthesia Post-Procedure Evaluation (Signed)
 Department of Anesthesiology  Postprocedure Note    Patient: Tara Hogan  MRN: [id number]  Birthdate: [birth year]  Date of evaluation: 10/28/2023    Procedure Summary       Date: 10/28/23 Room / Location: [facility] M[facility] / [facility] OR    Anesthesia Start:

## 2023-10-28 NOTE — ED Notes (Signed)
 TRANSFER - OUT REPORT:    Verbal report given to Joni Reining on Tara Hogan  being transferred to 3124 for routine progression of patient care       Report consisted of patient's Situation, Background, Assessment and   Recommendations(SBAR).     [employer]

## 2023-10-28 NOTE — Plan of Care (Signed)
 Problem: Pain  Goal: Verbalizes/displays adequate comfort level or baseline comfort level  10/28/2023 2358 by Troy Sine, RN  Outcome: Progressing  Flowsheets (Taken 10/28/2023 2358)  Verbalizes/displays adequate comfort level or baseline comfort level:

## 2023-10-28 NOTE — Consults (Cosign Needed)
 Keep NPO for cystoscopy left ureteral stent                   Urology Consult    Patient: Tara Hogan MRN: [id number]  SSN: [id number]    Date of Birth: May 27, 1956  Age: 67 y.o.  Sex: female          Date of Consultation:  October 28, 2023  Requesting

## 2023-10-28 NOTE — ED Notes (Signed)
 Patient OTF to CT at this time.

## 2023-10-29 LAB — CULTURE, BLOOD, PCR ID PANEL RESULTS REPORT
Acinetobacter calcoac baumannii complex by PCR: NOT DETECTED
Bacteroides fragilis by PCR: NOT DETECTED
Candida albicans by PCR: NOT DETECTED
Candida auris by PCR: NOT DETECTED
Candida glabrata: NOT DETECTED
Candida krusei by PCR: NOT DETECTED
Candida parapsilosis by PCR: NOT DETECTED
Candida tropicalis by PCR: NOT DETECTED
Cryptococcus neoformans/gattii by PCR: NOT DETECTED
Enterobacter cloacae complex by PCR: NOT DETECTED
Enterobacteriaceae by PCR: DETECTED — AB
Enterococcus faecalis by PCR: NOT DETECTED
Enterococcus faecium by PCR: NOT DETECTED
Escherichia Coli: NOT DETECTED
Haemophilus Influenzae by PCR: NOT DETECTED
KPC (Carbapenem resistance gene): NOT DETECTED
Klebsiella aerogenes by PCR: NOT DETECTED
Klebsiella oxytoca by PCR: NOT DETECTED
Klebsiella pneumoniae group by PCR: NOT DETECTED
Listeria monocytogenes by PCR: NOT DETECTED
Neisseria meningitidis by PCR: NOT DETECTED
Proteus by PCR: DETECTED — AB
Pseudomonas aeruginosa: NOT DETECTED
Resistant gene ctx-m by PCR: NOT DETECTED
Resistant gene imp by PCR: NOT DETECTED
Resistant gene ndm by PCR: NOT DETECTED
Resistant gene oxa-48-like by pcr: NOT DETECTED
Resistant gene vim by PCR: NOT DETECTED
STAPHYLOCOCCUS: NOT DETECTED
STREPTOCOCCUS: NOT DETECTED
Salmonella species by PCR: NOT DETECTED
Serratia marcescens by PCR: NOT DETECTED
Staphylococcus Aureus: NOT DETECTED
Staphylococcus epidermidis by PCR: NOT DETECTED
Staphylococcus lugdunensis by PCR: NOT DETECTED
Stenotrophomonas maltophilia by PCR: NOT DETECTED
Strep pneumoniae: NOT DETECTED
Strep pyogenes,(Grp. A): NOT DETECTED
Streptococcus agalactiae (Group B): NOT DETECTED

## 2023-10-29 LAB — COMPREHENSIVE METABOLIC PANEL
ALT: 14 U/L (ref 12–78)
AST: 30 U/L (ref 15–37)
Albumin/Globulin Ratio: 0.6 — ABNORMAL LOW (ref 1.1–2.2)
Albumin: 2.1 g/dL — ABNORMAL LOW (ref 3.5–5.0)
Alk Phosphatase: 83 U/L (ref 45–117)
Anion Gap: 4 mmol/L (ref 2–12)
BUN/Creatinine Ratio: 9 — ABNORMAL LOW (ref 12–20)
BUN: 18 mg/dL (ref 6–20)
CO2: 24 mmol/L (ref 21–32)
Calcium: 8.3 mg/dL — ABNORMAL LOW (ref 8.5–10.1)
Chloride: 112 mmol/L — ABNORMAL HIGH (ref 97–108)
Creatinine: 1.98 mg/dL — ABNORMAL HIGH (ref 0.55–1.02)
Est, Glom Filt Rate: 27 mL/min/{1.73_m2} — ABNORMAL LOW (ref >60)
Globulin: 3.7 g/dL (ref 2.0–4.0)
Glucose: 171 mg/dL — ABNORMAL HIGH (ref 65–100)
Potassium: 4.2 mmol/L (ref 3.5–5.1)
Sodium: 140 mmol/L (ref 136–145)
Total Bilirubin: 0.3 mg/dL (ref 0.2–1.0)
Total Protein: 5.8 g/dL — ABNORMAL LOW (ref 6.4–8.2)

## 2023-10-29 LAB — CBC WITH AUTO DIFFERENTIAL
Basophils %: 0 % (ref 0–1)
Basophils Absolute: 0 10*3/uL (ref 0.0–0.1)
Eosinophils %: 1 % (ref 0–7)
Eosinophils Absolute: 0.2 10*3/uL (ref 0.0–0.4)
Hematocrit: 33.6 % — ABNORMAL LOW (ref 35.0–47.0)
Hemoglobin: 10.6 g/dL — ABNORMAL LOW (ref 11.5–16.0)
Immature Granulocytes %: 1 % — ABNORMAL HIGH (ref 0.0–0.5)
Immature Granulocytes Absolute: 0.2 10*3/uL — ABNORMAL HIGH (ref 0.00–0.04)
Lymphocytes %: 2 % — ABNORMAL LOW (ref 12–49)
Lymphocytes Absolute: 0.4 10*3/uL — ABNORMAL LOW (ref 0.8–3.5)
MCH: 26 pg (ref 26.0–34.0)
MCHC: 31.5 g/dL (ref 30.0–36.5)
MCV: 82.6 fL (ref 80.0–99.0)
MPV: 9.7 fL (ref 8.9–12.9)
Monocytes %: 2 % — ABNORMAL LOW (ref 5–13)
Monocytes Absolute: 0.4 10*3/uL (ref 0.0–1.0)
Neutrophils %: 94 % — ABNORMAL HIGH (ref 32–75)
Neutrophils Absolute: 18.1 10*3/uL — ABNORMAL HIGH (ref 1.8–8.0)
Nucleated RBCs: 0 /100{WBCs}
Platelets: 208 10*3/uL (ref 150–400)
RBC: 4.07 M/uL (ref 3.80–5.20)
RDW: 17.3 % — ABNORMAL HIGH (ref 11.5–14.5)
WBC: 19.3 10*3/uL — ABNORMAL HIGH (ref 3.6–11.0)
nRBC: 0 10*3/uL (ref 0.00–0.01)

## 2023-10-29 LAB — HEMOGLOBIN A1C
Estimated Avg Glucose: 114 mg/dL
Hemoglobin A1C: 5.6 % (ref 4.0–5.6)

## 2023-10-29 LAB — CULTURE, URINE: Culture: NO GROWTH

## 2023-10-29 MED ORDER — SUCCINYLCHOLINE CHLORIDE 20 MG/ML IJ SOLN
20 | Freq: Once | INTRAMUSCULAR | Status: DC | PRN
Start: 2023-10-29 — End: 2023-10-28
  Administered 2023-10-29: 02:00:00 140 via INTRAVENOUS

## 2023-10-29 MED ORDER — STERILE WATER FOR INJECTION (MIXTURES ONLY)
2 g | INTRAMUSCULAR | Status: AC
Start: 2023-10-29 — End: 2023-11-04
  Administered 2023-10-29 – 2023-11-01 (×4): 2000 mg via INTRAVENOUS

## 2023-10-29 MED ORDER — MEPERIDINE HCL 25 MG/ML IJ SOLN
25 | INTRAMUSCULAR | Status: DC | PRN
Start: 2023-10-29 — End: 2023-10-28

## 2023-10-29 MED ORDER — PROPOFOL 100 MG/10ML IV EMUL
100 | Freq: Once | INTRAVENOUS | Status: DC | PRN
Start: 2023-10-29 — End: 2023-10-28
  Administered 2023-10-29: 02:00:00 150 via INTRAVENOUS

## 2023-10-29 MED ORDER — FENTANYL CITRATE (PF) 100 MCG/2ML IJ SOLN
100 | Freq: Once | INTRAMUSCULAR | Status: DC | PRN
Start: 2023-10-29 — End: 2023-10-28
  Administered 2023-10-29 (×2): 50 via INTRAVENOUS

## 2023-10-29 MED ORDER — LACTATED RINGERS IV SOLN
INTRAVENOUS | Status: DC | PRN
Start: 2023-10-29 — End: 2023-10-28
  Administered 2023-10-29: 02:00:00 via INTRAVENOUS

## 2023-10-29 MED ORDER — ENOXAPARIN SODIUM 30 MG/0.3ML IJ SOSY
30 | Freq: Two times a day (BID) | INTRAMUSCULAR | Status: AC
Start: 2023-10-29 — End: ?

## 2023-10-29 MED ORDER — SODIUM CHLORIDE 0.9 % IV SOLN
0.9 | INTRAVENOUS | Status: DC | PRN
Start: 2023-10-29 — End: 2023-10-28
  Administered 2023-10-29: 02:00:00 20 via INTRAVENOUS

## 2023-10-29 MED ORDER — ONDANSETRON HCL 4 MG/2ML IJ SOLN
4 | Freq: Once | INTRAMUSCULAR | Status: DC | PRN
Start: 2023-10-29 — End: 2023-10-28
  Administered 2023-10-29: 03:00:00 4 via INTRAVENOUS

## 2023-10-29 MED ORDER — DEXAMETHASONE 4 MG/ML IJ SOLN (MIXTURES ONLY)
4 | Freq: Once | INTRAMUSCULAR | Status: DC | PRN
Start: 2023-10-29 — End: 2023-10-28
  Administered 2023-10-29: 02:00:00 8 via INTRAVENOUS

## 2023-10-29 MED ORDER — NORMAL SALINE FLUSH 0.9 % IV SOLN
0.9 | Freq: Two times a day (BID) | INTRAVENOUS | Status: DC
Start: 2023-10-29 — End: 2023-10-28

## 2023-10-29 MED ORDER — LIDOCAINE HCL (PF) 2 % IJ SOLN
2 | Freq: Once | INTRAMUSCULAR | Status: DC | PRN
Start: 2023-10-29 — End: 2023-10-28
  Administered 2023-10-29: 02:00:00 100 via INTRAVENOUS

## 2023-10-29 MED ORDER — NORMAL SALINE FLUSH 0.9 % IV SOLN
0.9 | INTRAVENOUS | Status: DC | PRN
Start: 2023-10-29 — End: 2023-10-28

## 2023-10-29 MED ORDER — NALOXONE HCL 0.4 MG/ML IJ SOLN
0.4 | INTRAMUSCULAR | Status: DC | PRN
Start: 2023-10-29 — End: 2023-10-28

## 2023-10-29 MED ORDER — HYDROMORPHONE HCL PF 1 MG/ML IJ SOLN
1 | INTRAMUSCULAR | Status: DC | PRN
Start: 2023-10-29 — End: 2023-10-28

## 2023-10-29 MED ORDER — PHENYLEPHRINE HCL (PRESSORS) 0.4 MG/10ML IV SOSY
0.4 | Freq: Once | INTRAVENOUS | Status: DC | PRN
Start: 2023-10-29 — End: 2023-10-28
  Administered 2023-10-29 (×2): 120 via INTRAVENOUS
  Administered 2023-10-29 (×2): 80 via INTRAVENOUS
  Administered 2023-10-29: 02:00:00 120 via INTRAVENOUS

## 2023-10-29 MED ORDER — FENTANYL CITRATE (PF) 100 MCG/2ML IJ SOLN
100 | INTRAMUSCULAR | Status: AC
Start: 2023-10-29 — End: ?

## 2023-10-29 MED ORDER — MIDAZOLAM HCL 2 MG/2ML IJ SOLN
2 | INTRAMUSCULAR | Status: AC
Start: 2023-10-29 — End: ?

## 2023-10-29 MED ORDER — SODIUM CHLORIDE 0.9 % IV SOLN
0.9 | INTRAVENOUS | Status: DC | PRN
Start: 2023-10-29 — End: 2023-10-28

## 2023-10-29 MED ORDER — ONDANSETRON HCL 4 MG/2ML IJ SOLN
4 | Freq: Once | INTRAMUSCULAR | Status: DC | PRN
Start: 2023-10-29 — End: 2023-10-28

## 2023-10-29 MED ORDER — PHENYLEPHRINE HCL (PRESSORS) 0.4 MG/10ML IV SOSY
0.4 | INTRAVENOUS | Status: AC
Start: 2023-10-29 — End: ?

## 2023-10-29 MED ORDER — OXYCODONE HCL 5 MG PO TABS
5 | ORAL | Status: DC | PRN
Start: 2023-10-29 — End: 2023-11-01
  Administered 2023-10-30 (×3): 5 mg via ORAL

## 2023-10-29 MED ORDER — PROCHLORPERAZINE EDISYLATE 10 MG/2ML IJ SOLN
10 | Freq: Once | INTRAMUSCULAR | Status: DC | PRN
Start: 2023-10-29 — End: 2023-10-28

## 2023-10-29 MED ORDER — FENTANYL CITRATE (PF) 100 MCG/2ML IJ SOLN
100 | INTRAMUSCULAR | Status: DC | PRN
Start: 2023-10-29 — End: 2023-10-28

## 2023-10-29 MED FILL — CEFTRIAXONE SODIUM 2 G IJ SOLR: 2 g | INTRAMUSCULAR | Qty: 2000 | Fill #0

## 2023-10-29 MED FILL — DILAUDID 1 MG/ML IJ SOLN: 1 MG/ML | INTRAMUSCULAR | Qty: 1 | Fill #0

## 2023-10-29 MED FILL — DILAUDID 1 MG/ML IJ SOLN: 1 MG/ML | INTRAMUSCULAR | Qty: 1

## 2023-10-29 MED FILL — ENOXAPARIN SODIUM 30 MG/0.3ML IJ SOSY: 30 MG/0.3ML | INTRAMUSCULAR | Qty: 0.3 | Fill #0

## 2023-10-29 MED FILL — FENTANYL CITRATE (PF) 100 MCG/2ML IJ SOLN: 100 MCG/2ML | INTRAMUSCULAR | Qty: 2

## 2023-10-29 MED FILL — PHENYLEPHRINE HCL (PRESSORS) 0.4 MG/10ML IV SOSY: 0.4 MG/10ML | INTRAVENOUS | Qty: 10

## 2023-10-29 MED FILL — NICOTINE 21 MG/24HR TD PT24: 21 MG/24HR | TRANSDERMAL | Qty: 1 | Fill #0

## 2023-10-29 MED FILL — ASPIRIN LOW DOSE 81 MG PO CHEW: 81 MG | ORAL | Qty: 1 | Fill #0

## 2023-10-29 MED FILL — MIDAZOLAM HCL 2 MG/2ML IJ SOLN: 2 MG/ML | INTRAMUSCULAR | Qty: 2

## 2023-10-29 NOTE — Progress Notes (Signed)
 End of Shift Note    Bedside shift change report given to Leandro,RN (oncoming nurse) by Fernand Parkins, LPN (offgoing nurse).  Report included the following information SBAR    Shift worked:  7a-7p     Shift summary and any significant changes:    Pt reste

## 2023-10-29 NOTE — ACP (Advance Care Planning) (Signed)
 Advance Care Planning     Advance Care Planning Inpatient Note  Spiritual Care Department    Today's Date: 10/29/2023  Unit: [facility] 3 SURG TELE    Received request from IDT Member.  Upon review of chart and communication with care team, patient's decision [facility]

## 2023-10-29 NOTE — Progress Notes (Signed)
 Spiritual Health History and Assessment/Progress Note  [facility]    Follow-up,  ,  ,      Name: Tara Hogan MRN: [id number]    Age: 67 y.o.     Sex: female   Language: English   Religion: Baptist   Ureteral colic     Date: 12/6/2

## 2023-10-29 NOTE — Care Coordination-Inpatient (Addendum)
 Care Management Initial Assessment       RUR:  12%   Readmission? No  1st IM letter given? Yes - 10/28/23  1st [employer] letter given: No         10/29/23 1457   Service Assessment   Patient Orientation Alert and Oriented;Person;Place;Situation;Self   [employer]

## 2023-10-29 NOTE — Progress Notes (Signed)
 End of Shift Note    Bedside shift change report given to Tamia (oncoming nurse) by Troy Sine, RN (offgoing nurse).  Report included the following information MAR, Kardex, Recent Results    Shift worked:  7p-7a     Shift summary and any significant chang

## 2023-10-29 NOTE — Progress Notes (Signed)
 Urology Progress Note    Patient: Tara Hogan MRN: [id number]  SSN: [id number]    Date of Birth: [birth year]  Age: 67 y.o.  Sex: female            Assessment:     FU   2 mm L UVJ stone s/p cysto, ureteral ste

## 2023-10-29 NOTE — Consults (Signed)
 Pulmonary, Critical Care, and Sleep Medicine~Consult Note    Name: Tara Hogan MRN: [id number]   DOB: 1956-06-30 Hospital: [facility]   Date: 10/29/2023 3:36 PM Admission: 10/28/2023     Impression Plan   L

## 2023-10-29 NOTE — Progress Notes (Addendum)
 Hospitalist Progress Note    NAME:   Tara Hogan   DOB: 19-Aug-1956   MRN: [id number]     Date/Time: 10/29/2023 2:00 PM  Patient PCP: No primary care provider on file.    Estimated discharge date: 12/7-12/8  Barriers: Culture results      Assessment

## 2023-10-30 LAB — CBC
Hematocrit: 31.4 % — ABNORMAL LOW (ref 35.0–47.0)
Hemoglobin: 10 g/dL — ABNORMAL LOW (ref 11.5–16.0)
MCH: 26 pg (ref 26.0–34.0)
MCHC: 31.8 g/dL (ref 30.0–36.5)
MCV: 81.6 fL (ref 80.0–99.0)
MPV: 10.2 fL (ref 8.9–12.9)
Nucleated RBCs: 0 /100{WBCs}
Platelets: 234 10*3/uL (ref 150–400)
RBC: 3.85 M/uL (ref 3.80–5.20)
RDW: 17.2 % — ABNORMAL HIGH (ref 11.5–14.5)
WBC: 24.7 10*3/uL — ABNORMAL HIGH (ref 3.6–11.0)
nRBC: 0 10*3/uL (ref 0.00–0.01)

## 2023-10-30 LAB — BASIC METABOLIC PANEL
Anion Gap: 7 mmol/L (ref 2–12)
BUN/Creatinine Ratio: 17 (ref 12–20)
BUN: 26 mg/dL — ABNORMAL HIGH (ref 6–20)
CO2: 22 mmol/L (ref 21–32)
Calcium: 8.7 mg/dL (ref 8.5–10.1)
Chloride: 109 mmol/L — ABNORMAL HIGH (ref 97–108)
Creatinine: 1.55 mg/dL — ABNORMAL HIGH (ref 0.55–1.02)
Est, Glom Filt Rate: 37 mL/min/{1.73_m2} — ABNORMAL LOW (ref >60)
Glucose: 138 mg/dL — ABNORMAL HIGH (ref 65–100)
Potassium: 4.1 mmol/L (ref 3.5–5.1)
Sodium: 138 mmol/L (ref 136–145)

## 2023-10-30 LAB — CULTURE, URINE: Colony count: >100000

## 2023-10-30 MED ORDER — SIMETHICONE 80 MG PO CHEW
80 | Freq: Four times a day (QID) | ORAL | Status: DC | PRN
Start: 2023-10-30 — End: 2023-11-01
  Administered 2023-10-30 – 2023-10-31 (×2): 80 mg via ORAL

## 2023-10-30 MED ORDER — PROBIOTIC BLEND PO CAPS
Freq: Every day | ORAL | Status: DC
Start: 2023-10-30 — End: 2023-10-31

## 2023-10-30 MED FILL — OXYCODONE HCL 5 MG PO TABS: 5 MG | ORAL | Qty: 1 | Fill #0

## 2023-10-30 MED FILL — HEALTHYLAX 17 G PO PACK: 17 g | ORAL | Qty: 1 | Fill #0

## 2023-10-30 MED FILL — CEFTRIAXONE SODIUM 2 G IJ SOLR: 2 g | INTRAMUSCULAR | Qty: 2000 | Fill #0

## 2023-10-30 MED FILL — ASPIRIN LOW DOSE 81 MG PO CHEW: 81 MG | ORAL | Qty: 1 | Fill #0

## 2023-10-30 MED FILL — NICOTINE 21 MG/24HR TD PT24: 21 MG/24HR | TRANSDERMAL | Qty: 1 | Fill #0

## 2023-10-30 MED FILL — SIMETHICONE 80 MG PO CHEW: 80 MG | ORAL | Qty: 1 | Fill #0

## 2023-10-30 MED FILL — ENOXAPARIN SODIUM 30 MG/0.3ML IJ SOSY: 30 MG/0.3ML | INTRAMUSCULAR | Qty: 0.3 | Fill #0

## 2023-10-30 NOTE — Progress Notes (Signed)
 End of Shift Note    Bedside shift change report given to leandro,RN (oncoming nurse) by Fernand Parkins, LPN (offgoing nurse).  Report included the following information SBAR    Shift worked:  7a-7p     Shift summary and any significant changes:    Pt ambul

## 2023-10-30 NOTE — Progress Notes (Signed)
 End of Shift Note    Bedside shift change report given to Tamia (oncoming nurse) by Kendal Hymen, RN (offgoing nurse).  Report included the following information SBAR and MAR    Shift worked: 7pm - 7am     Shift summary and any significant changes:    Danielle Dess

## 2023-10-30 NOTE — Progress Notes (Signed)
 Nursing contacted Nocturnist/cross cover provider via non-urgent messaging system perfectserve and notified patient having gas pains, took simethicone already, constipated, reported last bm 12/02 with no acute concerning features reported. . No other conce

## 2023-10-30 NOTE — Progress Notes (Signed)
 Hospitalist Progress Note    NAME:   Tara Hogan   DOB: 03-10-1956   MRN: [id number]     Date/Time: 10/30/2023 12:53 PM  Patient PCP: No primary care provider on file.    Estimated discharge date: 12/8  Barriers: Culture results, procal      Assess

## 2023-10-31 ENCOUNTER — Inpatient Hospital Stay: Admit: 2023-10-31 | Payer: MEDICARE

## 2023-10-31 LAB — CULTURE, BLOOD 1

## 2023-10-31 LAB — CBC
Hematocrit: 33.6 % — ABNORMAL LOW (ref 35.0–47.0)
Hemoglobin: 10.4 g/dL — ABNORMAL LOW (ref 11.5–16.0)
MCH: 25.5 pg — ABNORMAL LOW (ref 26.0–34.0)
MCHC: 31 g/dL (ref 30.0–36.5)
MCV: 82.4 fL (ref 80.0–99.0)
MPV: 10.2 fL (ref 8.9–12.9)
Nucleated RBCs: 0.1 /100{WBCs} — ABNORMAL HIGH
Platelets: 272 10*3/uL (ref 150–400)
RBC: 4.08 M/uL (ref 3.80–5.20)
RDW: 17.1 % — ABNORMAL HIGH (ref 11.5–14.5)
WBC: 15.5 10*3/uL — ABNORMAL HIGH (ref 3.6–11.0)
nRBC: 0.02 10*3/uL — ABNORMAL HIGH (ref 0.00–0.01)

## 2023-10-31 LAB — BASIC METABOLIC PANEL
Anion Gap: 6 mmol/L (ref 2–12)
BUN/Creatinine Ratio: 17 (ref 12–20)
BUN: 21 mg/dL — ABNORMAL HIGH (ref 6–20)
CO2: 22 mmol/L (ref 21–32)
Calcium: 7.6 mg/dL — ABNORMAL LOW (ref 8.5–10.1)
Chloride: 114 mmol/L — ABNORMAL HIGH (ref 97–108)
Creatinine: 1.27 mg/dL — ABNORMAL HIGH (ref 0.55–1.02)
Est, Glom Filt Rate: 47 mL/min/{1.73_m2} — ABNORMAL LOW (ref >60)
Glucose: 86 mg/dL (ref 65–100)
Potassium: 3.7 mmol/L (ref 3.5–5.1)
Sodium: 142 mmol/L (ref 136–145)

## 2023-10-31 LAB — CULTURE, BLOOD 2

## 2023-10-31 LAB — MAGNESIUM: Magnesium: 1.9 mg/dL (ref 1.6–2.4)

## 2023-10-31 MED ORDER — CULTURELLE PO CAPS
Freq: Every day | ORAL | Status: DC
Start: 2023-10-31 — End: 2023-11-01
  Administered 2023-10-31 – 2023-11-01 (×2): 1 via ORAL

## 2023-10-31 MED ORDER — SODIUM CHLORIDE 0.9 % IV BOLUS
0.9 | Freq: Once | INTRAVENOUS | Status: AC
Start: 2023-10-31 — End: 2023-10-31
  Administered 2023-10-31: 06:00:00 500 mL via INTRAVENOUS

## 2023-10-31 MED ORDER — MAGNESIUM SULFATE IN D5W 1-5 GM/100ML-% IV SOLN
1-5 | Freq: Once | INTRAVENOUS | Status: AC
Start: 2023-10-31 — End: 2023-10-31
  Administered 2023-10-31: 12:00:00 1000 mg via INTRAVENOUS

## 2023-10-31 MED ORDER — POLYETHYLENE GLYCOL 3350 17 G PO PACK
17 | Freq: Once | ORAL | Status: DC
Start: 2023-10-31 — End: 2023-11-01

## 2023-10-31 MED ORDER — SODIUM CHLORIDE 0.9 % IV SOLN (ADD-VANTAGE)
0.9 | INTRAVENOUS | Status: DC
Start: 2023-10-31 — End: 2023-10-31
  Administered 2023-10-31: 06:00:00 5 mg/h via INTRAVENOUS
  Administered 2023-10-31: 15:00:00 15 mg/h via INTRAVENOUS

## 2023-10-31 MED ORDER — DILTIAZEM HCL 60 MG PO TABS
60 | Freq: Four times a day (QID) | ORAL | Status: DC
Start: 2023-10-31 — End: 2023-11-01
  Administered 2023-10-31 – 2023-11-01 (×4): 60 mg via ORAL

## 2023-10-31 MED ORDER — MELATONIN 3 MG PO TABS
3 | Freq: Every evening | ORAL | Status: DC | PRN
Start: 2023-10-31 — End: 2023-11-01
  Administered 2023-11-01: 02:00:00 6 mg via ORAL

## 2023-10-31 MED ORDER — CALCIUM GLUCONATE-NACL 1-0.675 GM/50ML-% IV SOLN
1-0.675 | Freq: Once | INTRAVENOUS | Status: AC
Start: 2023-10-31 — End: 2023-10-31
  Administered 2023-10-31: 11:00:00 1000 mg via INTRAVENOUS

## 2023-10-31 MED ORDER — DILTIAZEM HCL 25 MG/5ML IV SOLN
25 | Freq: Once | INTRAVENOUS | Status: AC
Start: 2023-10-31 — End: 2023-10-31
  Administered 2023-10-31: 06:00:00 10 mg via INTRAVENOUS

## 2023-10-31 MED ORDER — POTASSIUM CHLORIDE CRYS ER 20 MEQ PO TBCR
20 | Freq: Once | ORAL | Status: AC
Start: 2023-10-31 — End: 2023-10-31
  Administered 2023-10-31: 20:00:00 20 meq via ORAL

## 2023-10-31 MED ORDER — APIXABAN 5 MG PO TABS
5 | Freq: Two times a day (BID) | ORAL | Status: DC
Start: 2023-10-31 — End: 2023-11-01
  Administered 2023-11-01 (×2): 5 mg via ORAL

## 2023-10-31 MED ORDER — MAGNESIUM OXIDE -MG SUPPLEMENT 400 (240 MG) MG PO TABS
400 | Freq: Once | ORAL | Status: AC
Start: 2023-10-31 — End: 2023-10-31
  Administered 2023-10-31: 20:00:00 400 mg via ORAL

## 2023-10-31 MED FILL — DILTIAZEM HCL 100 MG IV SOLR: 100 MG | INTRAVENOUS | Qty: 1 | Fill #0

## 2023-10-31 MED FILL — ASPIRIN LOW DOSE 81 MG PO CHEW: 81 MG | ORAL | Qty: 1 | Fill #0

## 2023-10-31 MED FILL — CEFTRIAXONE SODIUM 2 G IJ SOLR: 2 g | INTRAMUSCULAR | Qty: 2000 | Fill #0

## 2023-10-31 MED FILL — DILTIAZEM HCL 60 MG PO TABS: 60 MG | ORAL | Qty: 1 | Fill #0

## 2023-10-31 MED FILL — ACETAMINOPHEN 325 MG PO TABS: 325 MG | ORAL | Qty: 2 | Fill #0

## 2023-10-31 MED FILL — HEALTHYLAX 17 G PO PACK: 17 g | ORAL | Qty: 1 | Fill #0

## 2023-10-31 MED FILL — NICOTINE 21 MG/24HR TD PT24: 21 MG/24HR | TRANSDERMAL | Qty: 1 | Fill #0

## 2023-10-31 MED FILL — CULTURELLE PO CAPS: ORAL | Qty: 1 | Fill #0

## 2023-10-31 MED FILL — MAGNESIUM SULFATE IN D5W 1-5 GM/100ML-% IV SOLN: 1-5 GM/100ML-% | INTRAVENOUS | Qty: 100 | Fill #0

## 2023-10-31 MED FILL — CALCIUM GLUCONATE-NACL 1-0.675 GM/50ML-% IV SOLN: 1-0.675 GM/50ML-% | INTRAVENOUS | Qty: 50 | Fill #0

## 2023-10-31 MED FILL — SIMETHICONE 80 MG PO CHEW: 80 MG | ORAL | Qty: 1 | Fill #0

## 2023-10-31 MED FILL — DILTIAZEM HCL 50 MG/10ML IV SOLN: 50 MG/10ML | INTRAVENOUS | Qty: 2 | Fill #0

## 2023-10-31 MED FILL — POTASSIUM CHLORIDE CRYS ER 20 MEQ PO TBCR: 20 MEQ | ORAL | Qty: 1 | Fill #0

## 2023-10-31 MED FILL — MAGNESIUM OXIDE -MG SUPPLEMENT 400 (240 MG) MG PO TABS: 400 (240 Mg) MG | ORAL | Qty: 1 | Fill #0

## 2023-10-31 NOTE — Progress Notes (Addendum)
 End of Shift Note    Bedside shift change report given to Marchelle Folks, Charity fundraiser (oncoming nurse) by Cyndia Diver, RN (offgoing nurse).  Report included the following information SBAR    Shift worked:  7A-7P     Shift summary and any significant changes:     Garment/textile technologist

## 2023-10-31 NOTE — Plan of Care (Signed)
 Problem: Discharge Planning  Goal: Discharge to home or other facility with appropriate resources  Outcome: Progressing  Flowsheets (Taken 10/31/2023 0730)  Discharge to home or other facility with appropriate resources: Identify barriers to discharge wit

## 2023-10-31 NOTE — Progress Notes (Addendum)
 Predictive Model Details          33 (Caution)  Factor Value    Calculated 10/31/2023 10:04 34% Supplemental oxygen Nasal cannula    Deterioration Index Model 45% Age 67 years old     12% Respiratory rate 19     11% WBC count abnormal (15.5 K/uL)     3% BUN

## 2023-10-31 NOTE — Significant Event (Signed)
 RAPID RESPONSE TEAM    Overhead rapid response paged to room # 3124  at 0005    Reason for rapid response: Afib RVR    Initial assessment:   Patient is alert and oriented, does not appear to be in distress. HR 140s- Afib on monitor, BP 150/87. Respirations

## 2023-10-31 NOTE — Plan of Care (Signed)
 Problem: Discharge Planning  Goal: Discharge to home or other facility with appropriate resources  10/30/2023 1217 by Fernand Parkins, LPN  Outcome: Progressing  Flowsheets (Taken 10/30/2023 0840)  Discharge to home or other facility with appropriate resour

## 2023-10-31 NOTE — Plan of Care (Signed)
 Problem: Discharge Planning  Goal: Discharge to home or other facility with appropriate resources  10/31/2023 2045 by Bertram Denver, RN  Outcome: Progressing  Flowsheets (Taken 10/31/2023 2044)  Discharge to home or other facility with appropriate resources

## 2023-10-31 NOTE — Progress Notes (Signed)
 4 Eyes Skin Assessment     NAME:  BASSHEVA FLURY  DATE OF BIRTH:  Sep 13, 1956  MEDICAL RECORD NUMBER:  [id number]    The patient is being assessed for  Transfer to New Unit    I agree that at least one RN has performed a thorough Head to Toe Skin Assessment

## 2023-10-31 NOTE — Progress Notes (Signed)
 Hospitalist Progress Note    NAME:   Tara Hogan   DOB: 30-May-1956   MRN: [id number]     Date/Time: 10/31/2023 3:35 PM    Patient PCP: No primary care provider on file.    Estimated discharge date: 12/9    Barriers: foley out, clinical improvement

## 2023-10-31 NOTE — Progress Notes (Addendum)
 End of Shift Note    Bedside shift change report given to Cammy Copa, RN (oncoming nurse) by Arnell Asal, RN (offgoing nurse).  Report included the following information SBAR, Kardex, Intake/Output, MAR, Recent Results, Med Rec Status, Alarm Parameters , an

## 2023-10-31 NOTE — Consults (Addendum)
 [facility]  [address]  [place], [place] [address]  [phone]  [email or web address]       CARDIOLOGY CONSULTATION       Date of  Admission: 10/28/2023 11:09 AM     Admission type:Emergency   Primary Care Physic

## 2023-10-31 NOTE — Progress Notes (Signed)
 RAPID RESPONSE NOTE:    BACKGROUND/ SITUATION:  67 year old African-American female admitted with sepsis secondary to UTI with 2 mm obstructing calculus and consequent left hydroureteronephrosis status post ureteral stenting found to be bacteremic with pro

## 2023-11-01 ENCOUNTER — Inpatient Hospital Stay: Admit: 2023-11-01 | Payer: MEDICARE

## 2023-11-01 DIAGNOSIS — Z6841 Body Mass Index (BMI) 40.0 and over, adult: Secondary | ICD-10-CM | POA: Diagnosis not present

## 2023-11-01 DIAGNOSIS — Z01419 Encounter for gynecological examination (general) (routine) without abnormal findings: Secondary | ICD-10-CM | POA: Diagnosis not present

## 2023-11-01 LAB — COMPREHENSIVE METABOLIC PANEL
ALT: 21 U/L (ref 12–78)
AST: 19 U/L (ref 15–37)
Albumin/Globulin Ratio: 0.6 — ABNORMAL LOW (ref 1.1–2.2)
Albumin: 2.1 g/dL — ABNORMAL LOW (ref 3.5–5.0)
Alk Phosphatase: 54 U/L (ref 45–117)
Anion Gap: 4 mmol/L (ref 2–12)
BUN/Creatinine Ratio: 13 (ref 12–20)
BUN: 15 mg/dL (ref 6–20)
CO2: 25 mmol/L (ref 21–32)
Calcium: 9 mg/dL (ref 8.5–10.1)
Chloride: 112 mmol/L — ABNORMAL HIGH (ref 97–108)
Creatinine: 1.19 mg/dL — ABNORMAL HIGH (ref 0.55–1.02)
Est, Glom Filt Rate: 50 mL/min/{1.73_m2} — ABNORMAL LOW (ref >60)
Globulin: 3.8 g/dL (ref 2.0–4.0)
Glucose: 95 mg/dL (ref 65–100)
Potassium: 3.7 mmol/L (ref 3.5–5.1)
Sodium: 141 mmol/L (ref 136–145)
Total Bilirubin: 0.4 mg/dL (ref 0.2–1.0)
Total Protein: 5.9 g/dL — ABNORMAL LOW (ref 6.4–8.2)

## 2023-11-01 LAB — ECHO (TTE) LIMITED (PRN CONTRAST/BUBBLE/STRAIN/3D)
Body Surface Area: 2.25 m2
EF Physician: 60 %
Fractional Shortening 2D: 26 % (ref 28–44)
IVSd: 1.1 cm — AB (ref 0.6–0.9)
LA Diameter: 3 cm
LA Size Index: 1.36 cm/m2
LA Volume A-L A4C: 29 mL (ref 22–52)
LA Volume Index A-L A4C: 13 mL/m2 — AB (ref 16–34)
LA Volume Index MOD A4C: 12 mL/m2 — AB (ref 16–34)
LA Volume MOD A4C: 27 mL (ref 22–52)
LV Mass 2D Index: 66.5 g/m2 (ref 43–95)
LV Mass 2D: 147 g (ref 67–162)
LV RWT Ratio: 0.48
LVIDd Index: 1.9 cm/m2
LVIDd: 4.2 cm (ref 3.9–5.3)
LVIDs Index: 1.4 cm/m2
LVIDs: 3.1 cm
LVOT Area: 3.1 cm2
LVOT Diameter: 2 cm
LVPWd: 1 cm — AB (ref 0.6–0.9)
MV Max Velocity: 1 m/s
MV Mean Gradient: 2 mm[Hg]
MV Mean Velocity: 0.7 m/s
MV Peak Gradient: 4 mm[Hg]
MV VTI: 25.3 cm
RA Volume: 39 mL
RA Volume: 41 mL
RVIDd: 3.4 cm

## 2023-11-01 LAB — TSH: TSH, 3rd Generation: 2.08 u[IU]/mL (ref 0.36–3.74)

## 2023-11-01 LAB — CBC WITH AUTO DIFFERENTIAL
Basophils %: 0 % (ref 0–1)
Basophils Absolute: 0 10*3/uL (ref 0.0–0.1)
Eosinophils %: 1 % (ref 0–7)
Eosinophils Absolute: 0.1 10*3/uL (ref 0.0–0.4)
Hematocrit: 32.7 % — ABNORMAL LOW (ref 35.0–47.0)
Hemoglobin: 10.1 g/dL — ABNORMAL LOW (ref 11.5–16.0)
Immature Granulocytes %: 1 % — ABNORMAL HIGH (ref 0.0–0.5)
Immature Granulocytes Absolute: 0.1 10*3/uL — ABNORMAL HIGH (ref 0.00–0.04)
Lymphocytes %: 17 % (ref 12–49)
Lymphocytes Absolute: 1.7 10*3/uL (ref 0.8–3.5)
MCH: 25.6 pg — ABNORMAL LOW (ref 26.0–34.0)
MCHC: 30.9 g/dL (ref 30.0–36.5)
MCV: 83 fL (ref 80.0–99.0)
MPV: 9.9 fL (ref 8.9–12.9)
Monocytes %: 8 % (ref 5–13)
Monocytes Absolute: 0.8 10*3/uL (ref 0.0–1.0)
Neutrophils %: 73 % (ref 32–75)
Neutrophils Absolute: 7.3 10*3/uL (ref 1.8–8.0)
Nucleated RBCs: 0 /100{WBCs}
Platelets: 288 10*3/uL (ref 150–400)
RBC: 3.94 M/uL (ref 3.80–5.20)
RDW: 17 % — ABNORMAL HIGH (ref 11.5–14.5)
WBC: 10 10*3/uL (ref 3.6–11.0)
nRBC: 0 10*3/uL (ref 0.00–0.01)

## 2023-11-01 LAB — MAGNESIUM: Magnesium: 2 mg/dL (ref 1.6–2.4)

## 2023-11-01 MED ORDER — CIPROFLOXACIN HCL 500 MG PO TABS
500 | ORAL_TABLET | Freq: Two times a day (BID) | ORAL | 0 refills | Status: AC
Start: 2023-11-01 — End: 2023-11-10

## 2023-11-01 MED ORDER — APIXABAN 5 MG PO TABS
5 | ORAL_TABLET | Freq: Two times a day (BID) | ORAL | 5 refills | Status: AC
Start: 2023-11-01 — End: ?

## 2023-11-01 MED ORDER — NICOTINE 21 MG/24HR TD PT24
21 | MEDICATED_PATCH | Freq: Every day | TRANSDERMAL | 3 refills | Status: AC
Start: 2023-11-01 — End: ?

## 2023-11-01 MED ORDER — OXYCODONE HCL 5 MG PO TABS
5 | ORAL_TABLET | Freq: Three times a day (TID) | ORAL | 0 refills | Status: AC | PRN
Start: 2023-11-01 — End: 2023-11-04

## 2023-11-01 MED ORDER — PERFLUTREN LIPID MICROSPHERE IV SUSP
INTRAVENOUS | Status: AC
Start: 2023-11-01 — End: ?

## 2023-11-01 MED ORDER — DILTIAZEM HCL ER COATED BEADS 180 MG PO CP24
180 | ORAL_CAPSULE | Freq: Every day | ORAL | 5 refills | Status: AC
Start: 2023-11-01 — End: ?

## 2023-11-01 MED ORDER — DILTIAZEM HCL ER COATED BEADS 180 MG PO CP24
180 | Freq: Every day | ORAL | Status: DC
Start: 2023-11-01 — End: 2023-11-01
  Administered 2023-11-01: 17:00:00 180 mg via ORAL

## 2023-11-01 MED FILL — ACETAMINOPHEN 325 MG PO TABS: 325 MG | ORAL | Qty: 2 | Fill #0

## 2023-11-01 MED FILL — DILTIAZEM HCL 60 MG PO TABS: 60 MG | ORAL | Qty: 1 | Fill #0

## 2023-11-01 MED FILL — ELIQUIS 5 MG PO TABS: 5 MG | ORAL | Qty: 1 | Fill #0

## 2023-11-01 MED FILL — MELATONIN 3 MG PO TABS: 3 MG | ORAL | Qty: 2 | Fill #0

## 2023-11-01 MED FILL — PERFLUTREN LIPID MICROSPHERE IV SUSP: INTRAVENOUS | Qty: 2 | Fill #0

## 2023-11-01 MED FILL — DILTIAZEM HCL ER COATED BEADS 180 MG PO CP24: 180 MG | ORAL | Qty: 1 | Fill #0

## 2023-11-01 MED FILL — CEFTRIAXONE SODIUM 2 G IJ SOLR: 2 g | INTRAMUSCULAR | Qty: 2000 | Fill #0

## 2023-11-01 MED FILL — NICOTINE 21 MG/24HR TD PT24: 21 MG/24HR | TRANSDERMAL | Qty: 1 | Fill #0

## 2023-11-01 MED FILL — ASPIRIN LOW DOSE 81 MG PO CHEW: 81 MG | ORAL | Qty: 1 | Fill #0

## 2023-11-01 MED FILL — CULTURELLE PO CAPS: ORAL | Qty: 1 | Fill #0

## 2023-11-01 NOTE — Other (Signed)
 Pt being discharged home with family. Pt IV removed. Telemetry removed. Pt dressed all belongings packed up. Discharge instructions reviewed with pt. Pt aware of all follow up appointments. All questions answered and clarified. Pt taken down to main lobby

## 2023-11-01 NOTE — Progress Notes (Signed)
 Hospitalist Progress Note    NAME:   Tara Hogan   DOB: [birth year]   MRN: [id number]     Date/Time: 11/01/2023 2:30 PM    Patient PCP: No primary care provider on file.    Estimated discharge date: 12/9    Barriers: foley out, clinical improvement

## 2023-11-01 NOTE — Discharge Summary (Signed)
 Hospitalist Discharge Note    NAME:   Tara Hogan   DOB: 1956/10/24   MRN: [id number]     Admit date: 10/28/2023    Discharge date: 11/01/23    PCP: No primary care provider on file.    Discharge Diagnoses:    Sepsis POA WBC 22.9 --> 10.0, temp 102

## 2023-11-01 NOTE — Discharge Instructions (Addendum)
 Left kidney stone obstructing the ureter or tube bringing the urine from the kidney down to the bladder    Proteus infection in the left kidney related to the stone that then got into the bloodstream     IV antibiotics in the hospital will complete 9 more

## 2023-11-01 NOTE — Care Coordination-Inpatient (Addendum)
 CLEAR FROM CM STANDPOINT    Transition of Care Plan:    RUR: 10% Low RUR  Prior Level of Functioning: Independent with ADLS/IADLS  Disposition: Home with follow up  EDD: 12/11  If SNF or IPR: Date FOC offered: na  Date FOC received:   Accepting facility:

## 2023-11-01 NOTE — Progress Notes (Signed)
 End of Shift Note    Bedside shift change report given to Donn Pierini RN (oncoming nurse) by Bertram Denver, RN (offgoing nurse).  Report included the following information SBAR, Intake/Output, MAR, Recent Results, and Cardiac Rhythm NSR    Shift worked:  7p-7a

## 2023-11-01 NOTE — Progress Notes (Signed)
 Hospital follow-up PCP transitional care appointment has been scheduled with Dr. Almyra Deforest on 11/16/23 at 0945. This is the first available appt due to limited provider availability. PCP office does not offer alternate provider option for hospital f

## 2023-11-01 NOTE — Progress Notes (Signed)
 [facility]  [address]  [place], [place] [address]  [phone]  [email or web address]  CARDIOLOGY PROGRESS NOTE    11/01/2023 9:57 AM    Admit Date: 10/28/2023    Admit Diagnosis:   Ureteral colic [N23]    Subj

## 2023-11-02 LAB — EKG 12-LEAD
Q-T Interval: 258 ms
QRS Duration: 92 ms
QTc Calculation (Bazett): 395 ms
R Axis: 80 degrees
T Axis: -59 degrees
Ventricular Rate: 141 {beats}/min

## 2023-11-04 LAB — CULTURE, BLOOD 2: Culture: NO GROWTH

## 2023-11-04 LAB — CULTURE, BLOOD 1: Culture: NO GROWTH

## 2024-02-01 DIAGNOSIS — G4733 Obstructive sleep apnea (adult) (pediatric): Secondary | ICD-10-CM | POA: Diagnosis not present

## 2024-02-01 DIAGNOSIS — Z6841 Body Mass Index (BMI) 40.0 and over, adult: Secondary | ICD-10-CM | POA: Diagnosis not present

## 2024-02-01 DIAGNOSIS — I10 Essential (primary) hypertension: Secondary | ICD-10-CM | POA: Diagnosis not present

## 2024-02-03 DIAGNOSIS — R350 Frequency of micturition: Secondary | ICD-10-CM | POA: Diagnosis not present

## 2024-02-03 DIAGNOSIS — R351 Nocturia: Secondary | ICD-10-CM | POA: Diagnosis not present

## 2024-02-24 DIAGNOSIS — G4733 Obstructive sleep apnea (adult) (pediatric): Secondary | ICD-10-CM | POA: Diagnosis not present

## 2024-03-01 DIAGNOSIS — N1339 Other hydronephrosis: Secondary | ICD-10-CM | POA: Diagnosis not present

## 2024-03-01 DIAGNOSIS — N390 Urinary tract infection, site not specified: Secondary | ICD-10-CM | POA: Diagnosis not present

## 2024-03-03 DIAGNOSIS — R197 Diarrhea, unspecified: Secondary | ICD-10-CM | POA: Diagnosis not present

## 2024-03-16 DIAGNOSIS — I10 Essential (primary) hypertension: Secondary | ICD-10-CM | POA: Diagnosis not present

## 2024-03-16 DIAGNOSIS — Z6841 Body Mass Index (BMI) 40.0 and over, adult: Secondary | ICD-10-CM | POA: Diagnosis not present

## 2024-03-16 DIAGNOSIS — Z79899 Other long term (current) drug therapy: Secondary | ICD-10-CM | POA: Diagnosis not present

## 2024-03-16 DIAGNOSIS — N390 Urinary tract infection, site not specified: Secondary | ICD-10-CM | POA: Diagnosis not present

## 2024-03-16 DIAGNOSIS — E782 Mixed hyperlipidemia: Secondary | ICD-10-CM | POA: Diagnosis not present

## 2024-03-16 DIAGNOSIS — J4520 Mild intermittent asthma, uncomplicated: Secondary | ICD-10-CM | POA: Diagnosis not present

## 2024-03-21 ENCOUNTER — Emergency Department (HOSPITAL_COMMUNITY)
Admission: EM | Admit: 2024-03-21 | Discharge: 2024-03-21 | Disposition: A | Discharge status: Home / Self Care | Attending: Emergency Medicine | Admitting: Emergency Medicine

## 2024-03-21 ENCOUNTER — Encounter (HOSPITAL_COMMUNITY): Payer: Self-pay | Admitting: Emergency Medicine

## 2024-03-21 ENCOUNTER — Other Ambulatory Visit: Payer: Self-pay

## 2024-03-21 ENCOUNTER — Emergency Department (HOSPITAL_COMMUNITY)

## 2024-03-21 DIAGNOSIS — N281 Cyst of kidney, acquired: Secondary | ICD-10-CM | POA: Diagnosis not present

## 2024-03-21 DIAGNOSIS — K7689 Other specified diseases of liver: Secondary | ICD-10-CM | POA: Diagnosis not present

## 2024-03-21 DIAGNOSIS — K529 Noninfective gastroenteritis and colitis, unspecified: Secondary | ICD-10-CM

## 2024-03-21 DIAGNOSIS — R109 Unspecified abdominal pain: Secondary | ICD-10-CM | POA: Diagnosis present

## 2024-03-21 DIAGNOSIS — K5730 Diverticulosis of large intestine without perforation or abscess without bleeding: Secondary | ICD-10-CM | POA: Diagnosis not present

## 2024-03-21 DIAGNOSIS — R935 Abnormal findings on diagnostic imaging of other abdominal regions, including retroperitoneum: Secondary | ICD-10-CM | POA: Diagnosis not present

## 2024-03-21 DIAGNOSIS — R1030 Lower abdominal pain, unspecified: Secondary | ICD-10-CM | POA: Diagnosis not present

## 2024-03-21 LAB — COMPREHENSIVE METABOLIC PANEL WITH GFR
ALT: 17 U/L (ref 0–44)
AST: 18 U/L (ref 15–41)
Albumin: 4.1 g/dL (ref 3.5–5.0)
Alkaline Phosphatase: 119 U/L (ref 38–126)
Anion gap: 12 (ref 5–15)
BUN: 28 mg/dL — ABNORMAL HIGH (ref 8–23)
CO2: 24 mmol/L (ref 22–32)
Calcium: 9.6 mg/dL (ref 8.9–10.3)
Chloride: 100 mmol/L (ref 98–111)
Creatinine, Ser: 0.96 mg/dL (ref 0.44–1.00)
GFR, Estimated: >60 mL/min (ref >60)
Glucose, Bld: 103 mg/dL — ABNORMAL HIGH (ref 70–99)
Potassium: 3.5 mmol/L (ref 3.5–5.1)
Sodium: 136 mmol/L (ref 135–145)
Total Bilirubin: 0.5 mg/dL (ref 0.0–1.2)
Total Protein: 8.7 g/dL — ABNORMAL HIGH (ref 6.5–8.1)

## 2024-03-21 LAB — CBC WITH DIFFERENTIAL/PLATELET
Abs Immature Granulocytes: 0.02 10*3/uL (ref 0.00–0.07)
Basophils Absolute: 0.1 10*3/uL (ref 0.0–0.1)
Basophils Relative: 1 %
Eosinophils Absolute: 0.3 10*3/uL (ref 0.0–0.5)
Eosinophils Relative: 3 %
HCT: 38.9 % (ref 36.0–46.0)
Hemoglobin: 12.7 g/dL (ref 12.0–15.0)
Immature Granulocytes: 0 %
Lymphocytes Relative: 24 %
Lymphs Abs: 2.6 10*3/uL (ref 0.7–4.0)
MCH: 27.7 pg (ref 26.0–34.0)
MCHC: 32.6 g/dL (ref 30.0–36.0)
MCV: 84.9 fL (ref 80.0–100.0)
Monocytes Absolute: 0.6 10*3/uL (ref 0.1–1.0)
Monocytes Relative: 5 %
Neutro Abs: 7.4 10*3/uL (ref 1.7–7.7)
Neutrophils Relative %: 67 %
Platelets: 357 10*3/uL (ref 150–400)
RBC: 4.58 MIL/uL (ref 3.87–5.11)
RDW: 14.6 % (ref 11.5–15.5)
WBC: 11 10*3/uL — ABNORMAL HIGH (ref 4.0–10.5)
nRBC: 0 % (ref 0.0–0.2)

## 2024-03-21 LAB — LIPASE, BLOOD: Lipase: 25 U/L (ref 11–51)

## 2024-03-21 LAB — URINALYSIS, ROUTINE W REFLEX MICROSCOPIC
Bilirubin Urine: NEGATIVE
Glucose, UA: NEGATIVE mg/dL
Hgb urine dipstick: NEGATIVE
Ketones, ur: NEGATIVE mg/dL
Leukocytes,Ua: NEGATIVE
Nitrite: NEGATIVE
Protein, ur: NEGATIVE mg/dL
Specific Gravity, Urine: 1.003 — ABNORMAL LOW (ref 1.005–1.030)
pH: 6 (ref 5.0–8.0)

## 2024-03-21 MED ORDER — ONDANSETRON 4 MG PO TBDP: 4.0000 mg | ORAL_TABLET | Freq: Three times a day (TID) | ORAL | 0 refills | Status: AC | PRN

## 2024-03-21 MED ORDER — AMOXICILLIN-POT CLAVULANATE 875-125 MG PO TABS: 1.0000 | ORAL_TABLET | Freq: Two times a day (BID) | ORAL | 0 refills | Status: AC

## 2024-03-21 MED ORDER — IOHEXOL 300 MG/ML  SOLN
100.0000 mL | Freq: Once | INTRAMUSCULAR | Status: AC | PRN
  Administered 2024-03-21: 100 mL via INTRAVENOUS

## 2024-03-21 MED ORDER — SUCRALFATE 1 GM/10ML PO SUSP: 1.0000 g | Freq: Three times a day (TID) | ORAL | 0 refills | Status: AC

## 2024-03-21 NOTE — ED Provider Triage Note (Signed)
 Emergency Medicine Provider Triage Evaluation Note  Tamara Walton , a 68 y.o. female  was evaluated in triage.  Pt complains of abdominal pain, onset about 1530 today.  Review of Systems  Positive: Abdominal pain Negative: Fever, chills, nausea, vomiting  Physical Exam  BP (S) (!) 207/99   Pulse (!) 116   Temp 97.9 F (36.6 C) (Oral)   Resp 18   SpO2 100%  Gen:   Awake, appears uncomfortable Resp:  Normal effort  MSK:   Moves extremities without difficulty  Other:  Abdomen soft. Tenderness to periumbilical area, with radiation bilaterally to RUQ and LUQ.  Medical Decision Making  Medically screening exam initiated at 7:12 PM.  Appropriate orders placed.  RINNAH BEILSTEIN was informed that the remainder of the evaluation will be completed by another provider, this initial triage assessment does not replace that evaluation, and the importance of remaining in the ED until their evaluation is complete.     Gigi Kyle, NP 03/21/24 2154

## 2024-03-21 NOTE — Discharge Instructions (Addendum)
 Take the antibiotic as prescribed. Also written for some Carafate help to coat your stomach. Stop taking ibuprofen at home  Make sure to follow-up outpatient, return for any worsening symptoms.

## 2024-03-21 NOTE — ED Triage Notes (Signed)
 Patient c/o abdominal pain x1 day. Patient report worsening generalize abdominal pain tonight Patient denies N/V. Patient denies fever.

## 2024-03-21 NOTE — ED Provider Notes (Signed)
 [place] EMERGENCY DEPARTMENT AT [facility] Provider Note   CSN: [id number] Arrival date & time: 03/21/24  1856    History  Chief Complaint  Patient presents with   Abdominal Pain    Tamara Walton is a 68 y.o. female here for evaluation of domino pain.  Started today.  Generalized in nature to mid abdomen.  Does not radiate to back.  Does note some urinary frequency without dysuria hematuria.  No history of kidney stones.  No fever, nausea, vomiting, chest pain, shortness of breath.  No pain or swelling to lower extremities.  No history of PE or DVT.  States he recently saw [facility] had an ultrasound of her kidney without significant abnormality.  Last bowel movement this morning.  Passing flatus.  No blood in stool.  HPI     Home Medications Prior to Admission medications   Medication Sig Start Date End Date Taking? Authorizing Provider  amoxicillin -clavulanate (AUGMENTIN ) 875-125 MG tablet Take 1 tablet by mouth every 12 (twelve) hours. 03/21/24  Yes Lacey Wallman A, PA-C  ondansetron (ZOFRAN-ODT) 4 MG disintegrating tablet Take 1 tablet (4 mg total) by mouth every 8 (eight) hours as needed. 03/21/24  Yes Nataliyah Packham A, PA-C  sucralfate (CARAFATE) 1 GM/10ML suspension Take 10 mLs (1 g total) by mouth 4 (four) times daily -  with meals and at bedtime. 03/21/24  Yes Rocko Fesperman A, PA-C  acetaminophen  (TYLENOL ) 500 MG tablet Take 1,000 mg by mouth every 6 (six) hours as needed for headache (pain).    [provider]  albuterol  (VENTOLIN  HFA) 108 (90 Base) MCG/ACT inhaler Inhale 2 puffs into the lungs every 6 (six) hours as needed for wheezing or shortness of breath.    [provider]  calcium  carbonate (TUMS EX) 750 MG chewable tablet Chew 1 tablet by mouth 3 (three) times daily as needed for heartburn.    [provider]  fluticasone  (FLONASE ) 50 MCG/ACT nasal spray Place 1 spray into both nostrils daily.    [provider]  hydrochlorothiazide  (HYDRODIURIL ) 25 MG tablet Take 25 mg by mouth daily. Taking 12.5mg  daily    [provider]  montelukast  (SINGULAIR ) 10 MG tablet Take 10 mg by mouth at bedtime. 06/03/20   [provider]  olmesartan (BENICAR) 5 MG tablet Take 10 mg by mouth daily. 08/06/21   [provider]  rosuvastatin  (CRESTOR ) 10 MG tablet Take 10 mg by mouth at bedtime. 01/27/22   [provider]      Allergies    Erythromycin, Levaquin  [levofloxacin ], Strawberry (diagnostic), Strawberry extract, Amoxicillin -pot clavulanate, and Lisinopril    Review of Systems   Review of Systems  Constitutional: Negative.   HENT: Negative.    Respiratory: Negative.    Cardiovascular: Negative.   Gastrointestinal:  Positive for abdominal pain. Negative for abdominal distention, blood in stool, constipation, diarrhea, nausea, rectal pain and vomiting.  Genitourinary: Negative.   Musculoskeletal: Negative.   Skin: Negative.   Neurological: Negative.   All other systems reviewed and are negative.   Physical Exam Updated Vital Signs BP (!) 153/76   Pulse 90   Temp 98 F (36.7 C) (Oral)   Resp 12   SpO2 100%  Physical Exam Vitals and nursing note reviewed.  Constitutional:      General: She is not in acute distress.    Appearance: She is well-developed. She is not ill-appearing, toxic-appearing or diaphoretic.  HENT:     Head: Atraumatic.  Eyes:  Pupils: Pupils are equal, round, and reactive to light.  Cardiovascular:     Rate and Rhythm: Normal rate.     Heart sounds: Normal heart sounds.  Pulmonary:     Effort: Pulmonary effort is normal. No respiratory distress.     Breath sounds: Normal breath sounds.  Abdominal:     General: Bowel sounds are normal. There is no distension.     Palpations: Abdomen is soft.     Tenderness: There is generalized abdominal tenderness and tenderness in the right lower quadrant, periumbilical area, suprapubic  area and left lower quadrant. There is no right CVA tenderness, left CVA tenderness, guarding or rebound. Negative signs include Murphy's sign and McBurney's sign.     Hernia: No hernia is present.  Musculoskeletal:        General: Normal range of motion.     Cervical back: Normal range of motion.  Skin:    General: Skin is warm and dry.  Neurological:     General: No focal deficit present.     Mental Status: She is alert.  Psychiatric:        Mood and Affect: Mood normal.     ED Results / Procedures / Treatments   Labs (all labs ordered are listed, but only abnormal results are displayed) Labs Reviewed  CBC WITH DIFFERENTIAL/PLATELET - Abnormal; Notable for the following components:      Result Value   WBC 11.0 (*)    All other components within normal limits  COMPREHENSIVE METABOLIC PANEL WITH GFR - Abnormal; Notable for the following components:   Glucose, Bld 103 (*)    BUN 28 (*)    Total Protein 8.7 (*)    All other components within normal limits  URINALYSIS, ROUTINE W REFLEX MICROSCOPIC - Abnormal; Notable for the following components:   Color, Urine COLORLESS (*)    Specific Gravity, Urine 1.003 (*)    All other components within normal limits  LIPASE, BLOOD    EKG None  Radiology CT ABDOMEN PELVIS W CONTRAST Result Date: 03/21/2024 CLINICAL DATA:  Abdominal pain for 1 day EXAM: CT ABDOMEN AND PELVIS WITH CONTRAST TECHNIQUE: Multidetector CT imaging of the abdomen and pelvis was performed using the standard protocol following bolus administration of intravenous contrast. RADIATION DOSE REDUCTION: This exam was performed according to the departmental dose-optimization program which includes automated exposure control, adjustment of the mA and/or kV according to patient size and/or use of iterative reconstruction technique. CONTRAST:  OMNIPAQUE  IOHEXOL  300 MG/ML  SOLN COMPARISON:  08/05/2009 FINDINGS: Lower chest: Linear areas of consolidation at the lung bases  consistent with atelectasis or scarring. Hepatobiliary: Stable right lobe liver cyst. No other focal liver abnormalities. The gallbladder is unremarkable. No biliary duct dilation. Pancreas: Unremarkable. No pancreatic ductal dilatation or surrounding inflammatory changes. Spleen: Normal in size without focal abnormality. Adrenals/Urinary Tract: The adrenals are unremarkable. Multiple simple bilateral renal cortical cysts are identified. There are additional subcentimeter hypodensities upper pole left kidney, too small to characterize but also likely reflecting cysts. No specific imaging follow-up is recommended. No urinary tract calculi or obstructive uropathy. The bladder is unremarkable. Stomach/Bowel: There are mildly dilated loops of small bowel in the right upper quadrant, with hyperenhancement of the intestinal mucosa and adjacent mesenteric edema, which may reflect inflammatory or infectious enteritis. No evidence of high-grade obstruction. Normal appendix right lower quadrant. Sigmoid diverticulosis without diverticulitis. Vascular/Lymphatic: Aortic atherosclerosis. No enlarged abdominal or pelvic lymph nodes. Reproductive: Status post hysterectomy. No adnexal masses. Other: Trace  pelvic free fluid. No free intraperitoneal gas. No abdominal wall hernia. Musculoskeletal: No acute or destructive bony abnormalities. Reconstructed images demonstrate no additional findings. IMPRESSION: 1. Mildly dilated loops of small bowel within the right mid abdomen, associated with increased mucosal enhancement and adjacent mesenteric fat stranding, compatible with inflammatory or infectious enteritis. No evidence of high-grade obstruction. 2. Sigmoid diverticulosis without diverticulitis. 3. Trace pelvic free fluid, nonspecific. 4.  Aortic Atherosclerosis (ICD10-I70.0). Electronically Signed   By: Bobbye Burrow M.D.   On: 03/21/2024 22:30    Procedures Procedures    Medications Ordered in ED Medications  iohexol   (OMNIPAQUE ) 300 MG/ML solution 100 mL (100 mLs Intravenous Contrast Given 03/21/24 2207)    ED Course/ Medical Decision Making/ A&P   68 year old here for evaluation of generalized mid and lower abdominal pain which began about 1 day ago.  Some associated urinary frequency without dysuria or hematuria.  No history of kidney stones.  Not worse with food intake.  She is afebrile, nonseptic, non-ill-appearing.  She has no clinical into VTE on exam.  Denies any chest pain or shortness of breath.  No back pain.  Will plan on labs, imaging and reassess.  She does not anything for pain at this time.  Labs and imaging personally viewed and interpreted:  CBC with leukocytosis 11.0 Metabolic panel without significant abnormality Lipase 25 UA negative for infection CT AP enteritis  Patient reassessed.  Discussed labs and imaging.  She does note that she is also been taking some ibuprofen for some pain.  Will write for Carafate.  Discussed sensation of NSAIDs.  Will cover for bacterial source given her pain.  Will have her follow-up outpatient, return for any worsening symptoms  Patient is nontoxic, nonseptic appearing, in no apparent distress.  Patient's pain and other symptoms adequately managed in emergency department.  Fluid bolus given.  Labs, imaging and vitals reviewed.  Patient does not meet the SIRS or Sepsis criteria.  On repeat exam patient does not have a surgical abdomin and there are no peritoneal signs.  No indication of appendicitis, bowel obstruction, bowel perforation, cholecystitis, diverticulitis, PID, intermittent/ persistent torsion, AAA, dissection or ectopic pregnancy.  Patient discharged home with symptomatic treatment and given strict instructions for follow-up with their primary care physician.  I have also discussed reasons to return immediately to the ER.  Patient expresses understanding and agrees with plan.                                  Medical Decision Making Amount  and/or Complexity of Data Reviewed Independent Historian: friend External Data Reviewed: labs, radiology, ECG and notes. Labs: ordered. Decision-making details documented in ED Course. Radiology: ordered and independent interpretation performed. Decision-making details documented in ED Course. ECG/medicine tests: ordered and independent interpretation performed. Decision-making details documented in ED Course.  Risk OTC drugs. Prescription drug management. Parenteral controlled substances. Decision regarding hospitalization. Diagnosis or treatment significantly limited by social determinants of health.          Final Clinical Impression(s) / ED Diagnoses Final diagnoses:  Enteritis    Rx / DC Orders ED Discharge Orders          Ordered    amoxicillin -clavulanate (AUGMENTIN ) 875-125 MG tablet  Every 12 hours        03/21/24 2252    ondansetron (ZOFRAN-ODT) 4 MG disintegrating tablet  Every 8 hours PRN        03/21/24  2252    sucralfate (CARAFATE) 1 GM/10ML suspension  3 times daily with meals & bedtime        03/21/24 2252              Ebone Alcivar A, PA-C 03/21/24 2254    Iva Mariner, MD 03/22/24 [phone]

## 2024-03-24 DIAGNOSIS — Z6841 Body Mass Index (BMI) 40.0 and over, adult: Secondary | ICD-10-CM | POA: Diagnosis not present

## 2024-03-24 DIAGNOSIS — K529 Noninfective gastroenteritis and colitis, unspecified: Secondary | ICD-10-CM | POA: Diagnosis not present

## 2024-03-30 DIAGNOSIS — R0683 Snoring: Secondary | ICD-10-CM | POA: Diagnosis not present

## 2024-03-30 DIAGNOSIS — E782 Mixed hyperlipidemia: Secondary | ICD-10-CM | POA: Diagnosis not present

## 2024-03-30 DIAGNOSIS — Z6841 Body Mass Index (BMI) 40.0 and over, adult: Secondary | ICD-10-CM | POA: Diagnosis not present

## 2024-03-30 DIAGNOSIS — I10 Essential (primary) hypertension: Secondary | ICD-10-CM | POA: Diagnosis not present

## 2024-03-30 DIAGNOSIS — Z1331 Encounter for screening for depression: Secondary | ICD-10-CM | POA: Diagnosis not present

## 2024-03-30 DIAGNOSIS — G4733 Obstructive sleep apnea (adult) (pediatric): Secondary | ICD-10-CM | POA: Diagnosis not present

## 2024-03-30 DIAGNOSIS — E65 Localized adiposity: Secondary | ICD-10-CM | POA: Diagnosis not present

## 2024-03-30 DIAGNOSIS — E66813 Obesity, class 3: Secondary | ICD-10-CM | POA: Diagnosis not present

## 2024-04-13 DIAGNOSIS — R0683 Snoring: Secondary | ICD-10-CM | POA: Diagnosis not present

## 2024-04-13 DIAGNOSIS — G4733 Obstructive sleep apnea (adult) (pediatric): Secondary | ICD-10-CM | POA: Diagnosis not present

## 2024-04-13 DIAGNOSIS — I10 Essential (primary) hypertension: Secondary | ICD-10-CM | POA: Diagnosis not present

## 2024-04-13 DIAGNOSIS — E65 Localized adiposity: Secondary | ICD-10-CM | POA: Diagnosis not present

## 2024-04-13 DIAGNOSIS — E782 Mixed hyperlipidemia: Secondary | ICD-10-CM | POA: Diagnosis not present

## 2024-04-13 DIAGNOSIS — Z6841 Body Mass Index (BMI) 40.0 and over, adult: Secondary | ICD-10-CM | POA: Diagnosis not present

## 2024-04-13 DIAGNOSIS — E66813 Obesity, class 3: Secondary | ICD-10-CM | POA: Diagnosis not present

## 2024-04-27 DIAGNOSIS — E65 Localized adiposity: Secondary | ICD-10-CM | POA: Diagnosis not present

## 2024-04-27 DIAGNOSIS — Z6841 Body Mass Index (BMI) 40.0 and over, adult: Secondary | ICD-10-CM | POA: Diagnosis not present

## 2024-04-27 DIAGNOSIS — G4733 Obstructive sleep apnea (adult) (pediatric): Secondary | ICD-10-CM | POA: Diagnosis not present

## 2024-04-27 DIAGNOSIS — E782 Mixed hyperlipidemia: Secondary | ICD-10-CM | POA: Diagnosis not present

## 2024-04-27 DIAGNOSIS — E66813 Obesity, class 3: Secondary | ICD-10-CM | POA: Diagnosis not present

## 2024-04-27 DIAGNOSIS — I10 Essential (primary) hypertension: Secondary | ICD-10-CM | POA: Diagnosis not present

## 2024-05-11 DIAGNOSIS — I10 Essential (primary) hypertension: Secondary | ICD-10-CM | POA: Diagnosis not present

## 2024-05-11 DIAGNOSIS — E66813 Obesity, class 3: Secondary | ICD-10-CM | POA: Diagnosis not present

## 2024-05-11 DIAGNOSIS — E782 Mixed hyperlipidemia: Secondary | ICD-10-CM | POA: Diagnosis not present

## 2024-05-11 DIAGNOSIS — E65 Localized adiposity: Secondary | ICD-10-CM | POA: Diagnosis not present

## 2024-05-11 DIAGNOSIS — Z6839 Body mass index (BMI) 39.0-39.9, adult: Secondary | ICD-10-CM | POA: Diagnosis not present

## 2024-05-11 DIAGNOSIS — G4733 Obstructive sleep apnea (adult) (pediatric): Secondary | ICD-10-CM | POA: Diagnosis not present

## 2024-05-15 DIAGNOSIS — J019 Acute sinusitis, unspecified: Secondary | ICD-10-CM | POA: Diagnosis not present

## 2024-06-01 DIAGNOSIS — Z6839 Body mass index (BMI) 39.0-39.9, adult: Secondary | ICD-10-CM | POA: Diagnosis not present

## 2024-06-01 DIAGNOSIS — E66813 Obesity, class 3: Secondary | ICD-10-CM | POA: Diagnosis not present

## 2024-06-01 DIAGNOSIS — E782 Mixed hyperlipidemia: Secondary | ICD-10-CM | POA: Diagnosis not present

## 2024-06-01 DIAGNOSIS — G4733 Obstructive sleep apnea (adult) (pediatric): Secondary | ICD-10-CM | POA: Diagnosis not present

## 2024-06-01 DIAGNOSIS — I10 Essential (primary) hypertension: Secondary | ICD-10-CM | POA: Diagnosis not present

## 2024-06-01 DIAGNOSIS — E65 Localized adiposity: Secondary | ICD-10-CM | POA: Diagnosis not present

## 2024-06-12 DIAGNOSIS — R300 Dysuria: Secondary | ICD-10-CM | POA: Diagnosis not present

## 2024-06-12 DIAGNOSIS — Z6839 Body mass index (BMI) 39.0-39.9, adult: Secondary | ICD-10-CM | POA: Diagnosis not present

## 2024-06-22 DIAGNOSIS — Z6838 Body mass index (BMI) 38.0-38.9, adult: Secondary | ICD-10-CM | POA: Diagnosis not present

## 2024-06-22 DIAGNOSIS — E65 Localized adiposity: Secondary | ICD-10-CM | POA: Diagnosis not present

## 2024-06-22 DIAGNOSIS — E66813 Obesity, class 3: Secondary | ICD-10-CM | POA: Diagnosis not present

## 2024-06-22 DIAGNOSIS — I10 Essential (primary) hypertension: Secondary | ICD-10-CM | POA: Diagnosis not present

## 2024-06-22 DIAGNOSIS — E782 Mixed hyperlipidemia: Secondary | ICD-10-CM | POA: Diagnosis not present

## 2024-06-22 DIAGNOSIS — G4733 Obstructive sleep apnea (adult) (pediatric): Secondary | ICD-10-CM | POA: Diagnosis not present

## 2024-06-23 ENCOUNTER — Encounter

## 2024-06-24 DIAGNOSIS — I10 Essential (primary) hypertension: Secondary | ICD-10-CM | POA: Diagnosis not present

## 2024-06-24 DIAGNOSIS — Z0130 Encounter for examination of blood pressure without abnormal findings: Secondary | ICD-10-CM | POA: Diagnosis not present

## 2024-07-04 ENCOUNTER — Ambulatory Visit: Payer: Medicare (Managed Care)

## 2024-07-04 ENCOUNTER — Other Ambulatory Visit: Payer: Self-pay | Admitting: Family Medicine

## 2024-07-04 DIAGNOSIS — Z1231 Encounter for screening mammogram for malignant neoplasm of breast: Secondary | ICD-10-CM

## 2024-07-06 DIAGNOSIS — R1032 Left lower quadrant pain: Secondary | ICD-10-CM | POA: Diagnosis not present

## 2024-07-06 DIAGNOSIS — R232 Flushing: Secondary | ICD-10-CM | POA: Diagnosis not present

## 2024-07-18 DIAGNOSIS — R1032 Left lower quadrant pain: Secondary | ICD-10-CM | POA: Diagnosis not present

## 2024-07-27 ENCOUNTER — Inpatient Hospital Stay
Admit: 2024-07-27 | Discharge: 2024-07-27 | Disposition: A | Discharge status: Home / Self Care | Payer: Medicare (Managed Care) | Arrived: VH

## 2024-07-27 ENCOUNTER — Emergency Department: Admit: 2024-07-27 | Payer: Medicare (Managed Care)

## 2024-07-27 ENCOUNTER — Ambulatory Visit
Admission: RE | Admit: 2024-07-27 | Discharge: 2024-07-27 | Disposition: A | Discharge status: Home / Self Care | Source: Ambulatory Visit | Attending: Family Medicine | Admitting: Family Medicine

## 2024-07-27 DIAGNOSIS — Z1231 Encounter for screening mammogram for malignant neoplasm of breast: Secondary | ICD-10-CM

## 2024-07-27 DIAGNOSIS — G8929 Other chronic pain: Principal | ICD-10-CM

## 2024-07-27 DIAGNOSIS — G5682 Other specified mononeuropathies of left upper limb: Secondary | ICD-10-CM

## 2024-07-27 MED ORDER — KETOROLAC TROMETHAMINE 30 MG/ML IJ SOLN
30 | INTRAMUSCULAR | Status: AC
Start: 2024-07-27 — End: 2024-07-27
  Administered 2024-07-27: 22:00:00 30 mg via INTRAMUSCULAR

## 2024-07-27 MED ORDER — PREDNISONE 20 MG PO TABS
20 | ORAL_TABLET | Freq: Every day | ORAL | 0 refills | Status: AC
Start: 2024-07-27 — End: 2024-08-01

## 2024-07-27 MED ORDER — OXYCODONE-ACETAMINOPHEN 5-325 MG PO TABS
5-325 | ORAL_TABLET | Freq: Four times a day (QID) | ORAL | 0 refills | Status: AC | PRN
Start: 2024-07-27 — End: 2024-07-30

## 2024-07-27 MED FILL — KETOROLAC TROMETHAMINE 30 MG/ML IJ SOLN: 30 mg/mL | INTRAMUSCULAR | Qty: 1 | Fill #0

## 2024-07-27 NOTE — ED Notes (Signed)
 Patient discharged by provider, discharge instructions provided to patient and reviewed, all questions answered. Vital signs stable, A/Ox4, breathing unlabored. Patient ambulatory on discharge

## 2024-07-27 NOTE — ED Provider Notes (Signed)
 [facility]  EMERGENCY DEPARTMENT ENCOUNTER         Pt Name: Tara Hogan  MRN: [id number]  Birthdate June 03, 1956  Date of evaluation: 07/27/2024  Provider: Melven Piggs, PA-C   PCP: Arlana Purchase, MD  Note Started: 6:22 PM EDT 07/27/24     CHIEF COMPLAINT       Chief Complaint   Patient presents with    Shoulder Pain     Patient ambulatory to triage with c/o left shoulder pain x 2 months, patient reports she was seen at urgent care and diagnosed with a pinched nerve and arthritis.         HISTORY OF PRESENT ILLNESS: 1 or more elements      History From: Patient  HPI Limitations: None  Arrival Mode:     [name] is a 68 y.o. female who presents with left shoulder pain for 2 months now.  Patient states that she was seen and evaluated urgent care and told that she had a pinched nerve and arthritis.  Patient has been taking Tylenol  and ibuprofen with no improvement of symptoms.  Patient states that any movement of her shoulder caused her extreme pain and she barely sleeps at night due to the pain.  Patient denies any injuries accidents or traumas.  She is in atraumatic appearing left shoulder and left arm.  No unilateral swelling.  Good distal perfusion.  Patient unable to express range of motion at this time due to severe pain.  No tenderness to palpation of the shoulder.  No prior Ortho history.     Nursing Notes were all reviewed and agreed with or any disagreements were addressed in the HPI.  Please see MDM for additional details of HPI and ROS     REVIEW OF SYSTEMS      Review of Systems   Musculoskeletal:  Positive for arthralgias and myalgias. Negative for joint swelling.   All other systems reviewed and are negative.       Positives and Pertinent negatives as per HPI.    PAST HISTORY     Past Medical History:  Past Medical History:   Diagnosis Date    COPD (chronic obstructive pulmonary disease) (HCC)        Past Surgical History:  Past Surgical History:   Procedure Laterality  Date    CATARACT EXTRACTION Bilateral     CESAREAN SECTION      x1    CYSTOSCOPY Left 10/28/2023    CYSTOSCOPY, LEFT  URETERAL STENT INSERTION performed by Georgina Charleston, MD at [facility]    EYE SURGERY Left     Mass removed from behind left eye       Family History:  History reviewed. No pertinent family history.    Social History:  Social History     Tobacco Use    Smoking status: Every Day     Current packs/day: 1.00     Average packs/day: 1 pack/day for 60.7 years (60.7 ttl pk-yrs)     Types: Cigarettes     Start date: 1965    Smokeless tobacco: Never   Substance Use Topics    Alcohol use: Yes     Comment: Social/occasional    Drug use: Not Currently       Allergies:  No Known Allergies    CURRENT MEDICATIONS      Previous Medications    ALBUTEROL  SULFATE HFA (PROVENTIL ;VENTOLIN ;PROAIR ) 108 (90 BASE) MCG/ACT INHALER    Inhale 2 puffs  into the lungs every 4 hours as needed for Wheezing or Shortness of Breath    APIXABAN  (ELIQUIS ) 5 MG TABS TABLET    Take 1 tablet by mouth 2 times daily    DILTIAZEM  (CARDIZEM  CD) 180 MG EXTENDED RELEASE CAPSULE    Take 1 capsule by mouth daily    IPRATROPIUM 0.5 MG-ALBUTEROL  2.5 MG (DUONEB ) 0.5-2.5 (3) MG/3ML SOLN NEBULIZER SOLUTION    Take 3 mLs by nebulization every 6 hours as needed for Shortness of Breath or Wheezing    NICOTINE  (NICODERM CQ ) 21 MG/24HR    Place 1 patch onto the skin daily       PHYSICAL EXAM      ED Triage Vitals   Encounter Vitals Group      BP 07/27/24 1603 130/70      Girls Systolic BP Percentile --       Girls Diastolic BP Percentile --       Boys Systolic BP Percentile --       Boys Diastolic BP Percentile --       Pulse 07/27/24 1603 87      Respirations 07/27/24 1603 20      Temp 07/27/24 1603 98.4 F (36.9 C)      Temp src --       SpO2 07/27/24 1602 97 %      Weight - Scale 07/27/24 1601 97.1 kg (214 lb 1.1 oz)      Height --       Head Circumference --       Peak Flow --       Pain Score --       Pain Loc --       Pain Education --       Exclude  from Growth Chart --         Physical Exam  Constitutional:       Appearance: Normal appearance.   HENT:      Head: Normocephalic and atraumatic.   Cardiovascular:      Rate and Rhythm: Normal rate and regular rhythm.      Pulses: Normal pulses.      Heart sounds: Normal heart sounds.   Pulmonary:      Effort: Pulmonary effort is normal.      Breath sounds: Normal breath sounds.   Abdominal:      General: Abdomen is flat.      Palpations: Abdomen is soft.   Musculoskeletal:      Right shoulder: Normal.      Left shoulder: Tenderness present. No swelling, deformity, effusion, laceration, bony tenderness or crepitus. Decreased range of motion. Normal pulse.      Cervical back: Normal range of motion.      Comments: No tenderness to palpation of the shoulder or surrounding muscles.  Patient unable to exercise range of motion due to severe pain.  No numbness or tingling.  Good distal perfusion.  No unilateral swelling noted.   Skin:     General: Skin is warm and dry.      Capillary Refill: Capillary refill takes less than 2 seconds.   Neurological:      General: No focal deficit present.      Mental Status: She is alert and oriented to person, place, and time. Mental status is at baseline.   Psychiatric:         Mood and Affect: Mood normal.         Behavior: Behavior normal.  Thought Content: Thought content normal.         Judgment: Judgment normal.              EMERGENCY DEPARTMENT COURSE and DIFFERENTIAL DIAGNOSIS/MDM   Vitals:    Vitals:    07/27/24 1601 07/27/24 1602 07/27/24 1603   BP:   130/70   Pulse:   87   Resp:   20   Temp:   98.4 F (36.9 C)   SpO2:  97%    Weight: 97.1 kg (214 lb 1.1 oz)          MDM: CC/HPI Summary, DDx, ED Course, and Reassessment, Disposition Considerations (Tests not done, Shared Decision Making, Pt Expectation of Test or Tx.): X-ray unremarkable.  No evidence of arthritis per radiology report.  Will treat patient for a pinched nerve.  Unable to conduct a rotator cuff  examination because patient cannot tolerate this exam due to pain.  She will follow-up outpatient with orthopedics at her earliest convenience.  Advised her to return to the emergency room with any worsening symptoms.  Patient verbalized understanding and was ready for discharge at this time.        Abnormal Results Independent Interpretation: I reviewed and interpreted the labs as noted in ED course.       Patient was given the following medications:  Medications   ketorolac  (TORADOL ) injection 30 mg (30 mg IntraMUSCular Given 07/27/24 1828)       CONSULTS: (Who and What was discussed)  None    Chronic Conditions:   Past Medical History:   Diagnosis Date    COPD (chronic obstructive pulmonary disease) (HCC)          Social Determinants affecting Dx or Tx: None    Records Reviewed (source and summary of external records): Nursing Notes, Old Medical Records, Previous Radiology Studies, and Previous Laboratory Studies        DIAGNOSTIC RESULTS   LABS:     No results found for this or any previous visit (from the past 24 hours).    RADIOLOGY:  Non-plain film images such as CT, Ultrasound and MRI are read by the radiologist. Plain radiographic images are visualized and preliminarily interpreted by myself with the below findings:          Interpretation per the Radiologist below, if available at the time of this note:     XR SHOULDER LEFT (MIN 2 VIEWS)  Result Date: 07/27/2024  EXAM: XR SHOULDER LEFT (MIN 2 VIEWS) INDICATION: pain. COMPARISON: None. FINDINGS: Three views of the left shoulder demonstrate no fracture, dislocation or other acute abnormality.     No acute abnormality. Electronically signed by REDELL CUNNING       PROCEDURES   Unless otherwise noted below, none  Procedures       FINAL IMPRESSION     1. 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  Patient has no new complaints, changes, or physical findings.  Results of x-ray were reviewed with the patient. All medications were reviewed with the patient; will d/c home with Percocet and prednisone . All of pt's questions and concerns were addressed. Patient was instructed and agrees to follow up with orthopedics, as well as to  return to the ED upon further deterioration. Patient is ready to go home.      PATIENT REFERRED TO:  [facility]  [address]  [address]  [place]  [id number]  [phone]           DISCHARGE MEDICATIONS:     Medication List        START taking these medications      oxyCODONE -acetaminophen  5-325 MG per tablet  Commonly known as: Percocet  Take 1 tablet by mouth every 6 hours as needed for Pain for up to 3 days. Intended supply: 3 days. Take lowest dose possible to manage pain Max Daily Amount: 4 tablets     predniSONE  20 MG tablet  Commonly known as: DELTASONE   Take 1 tablet by mouth daily for 5 days            ASK your doctor about these medications      albuterol  sulfate HFA 108 (90 Base) MCG/ACT inhaler  Commonly known as: PROVENTIL ;VENTOLIN ;PROAIR      apixaban  5 MG Tabs tablet  Commonly known as: ELIQUIS   Take 1 tablet by mouth 2 times daily     dilTIAZem  180 MG extended release capsule  Commonly known as: CARDIZEM  CD  Take 1 capsule by mouth daily     ipratropium 0.5 mg-albuterol  2.5 mg 0.5-2.5 (3) MG/3ML Soln nebulizer solution  Commonly known as: DUONEB      nicotine  21 MG/24HR  Commonly known as: NICODERM CQ   Place 1 patch onto the skin daily               Where to Get Your Medications        These medications were sent to [facility] [address] [place] - [phone] [phone]  [address] [place], [place] [address]      Phone: [phone]   oxyCODONE -acetaminophen  5-325 MG per tablet  predniSONE  20 MG tablet           DISCONTINUED MEDICATIONS:  Current Discharge  Medication List          I have seen and evaluated the patient autonomously. My supervision physician was on site and available for consultation if needed.     I am the Primary Clinician of Record.   Melven Piggs, PA-C (electronically signed)    (This note was generated using [employer] AI/ambient AR software if any parties were reported to generate this note outside of the software user there consent was obtained prior to recording the voices all portions of this note were verified by the clinician prior to inclusion in the medical record.  In addition, parts of this dictation were completed with voice recognition software. Quite often unanticipated grammatical, syntax, homophones, and other interpretive errors are inadvertently transcribed by the computer software. Please disregards these errors. Please excuse any errors that have escaped final proofreading.)     Piggs Melven, PA-C  07/27/24 1839

## 2024-08-01 DIAGNOSIS — E782 Mixed hyperlipidemia: Secondary | ICD-10-CM | POA: Diagnosis not present

## 2024-08-01 DIAGNOSIS — G4733 Obstructive sleep apnea (adult) (pediatric): Secondary | ICD-10-CM | POA: Diagnosis not present

## 2024-08-01 DIAGNOSIS — I10 Essential (primary) hypertension: Secondary | ICD-10-CM | POA: Diagnosis not present

## 2024-08-01 DIAGNOSIS — E66813 Obesity, class 3: Secondary | ICD-10-CM | POA: Diagnosis not present

## 2024-08-01 DIAGNOSIS — E65 Localized adiposity: Secondary | ICD-10-CM | POA: Diagnosis not present

## 2024-08-01 DIAGNOSIS — Z6838 Body mass index (BMI) 38.0-38.9, adult: Secondary | ICD-10-CM | POA: Diagnosis not present

## 2024-08-03 ENCOUNTER — Encounter

## 2024-08-04 DIAGNOSIS — I10 Essential (primary) hypertension: Secondary | ICD-10-CM | POA: Diagnosis not present

## 2024-08-17 ENCOUNTER — Inpatient Hospital Stay: Admit: 2024-08-17 | Payer: Medicare (Managed Care) | Attending: Pulmonary Disease

## 2024-08-17 ENCOUNTER — Inpatient Hospital Stay: Admit: 2024-08-17 | Payer: Medicare (Managed Care) | Attending: Orthopaedic Surgery

## 2024-08-17 DIAGNOSIS — M542 Cervicalgia: Principal | ICD-10-CM

## 2024-08-17 DIAGNOSIS — R9389 Abnormal findings on diagnostic imaging of other specified body structures: Principal | ICD-10-CM

## 2024-08-22 DIAGNOSIS — I10 Essential (primary) hypertension: Secondary | ICD-10-CM | POA: Diagnosis not present

## 2024-08-22 DIAGNOSIS — E782 Mixed hyperlipidemia: Secondary | ICD-10-CM | POA: Diagnosis not present

## 2024-08-22 DIAGNOSIS — G4733 Obstructive sleep apnea (adult) (pediatric): Secondary | ICD-10-CM | POA: Diagnosis not present

## 2024-08-22 DIAGNOSIS — E66813 Obesity, class 3: Secondary | ICD-10-CM | POA: Diagnosis not present

## 2024-08-22 DIAGNOSIS — J4520 Mild intermittent asthma, uncomplicated: Secondary | ICD-10-CM | POA: Diagnosis not present

## 2024-08-23 DIAGNOSIS — E782 Mixed hyperlipidemia: Secondary | ICD-10-CM | POA: Diagnosis not present

## 2024-08-23 DIAGNOSIS — I10 Essential (primary) hypertension: Secondary | ICD-10-CM | POA: Diagnosis not present

## 2024-08-23 DIAGNOSIS — E66813 Obesity, class 3: Secondary | ICD-10-CM | POA: Diagnosis not present

## 2024-08-23 DIAGNOSIS — G4733 Obstructive sleep apnea (adult) (pediatric): Secondary | ICD-10-CM | POA: Diagnosis not present

## 2024-08-23 DIAGNOSIS — E65 Localized adiposity: Secondary | ICD-10-CM | POA: Diagnosis not present

## 2024-08-23 DIAGNOSIS — Z6837 Body mass index (BMI) 37.0-37.9, adult: Secondary | ICD-10-CM | POA: Diagnosis not present

## 2024-08-30 NOTE — Progress Notes (Signed)
 "ASSESSMENT / PLAN:  Psychosocial risk factors:     Symptoms:   Neck and left upper extremity pain.  Left shoulder pain.    Pathology:   Partially visualized thoracic cord expansion which may represent a syrinx.  No significant central canal nor foraminal stenosis.  Intrinsic shoulder pathology.    Interventional procedure options: No cervical epidural steroid injection indicated at this time.      Surgical options: No cervical spine surgery indicated this time.      DAX generated assessment/plan:  Assessment & Plan  1. Left shoulder pain:  The pain is likely due to intrinsic shoulder pathology, possibly a rotator cuff tear. An MRI of the shoulder is recommended to further evaluate the cause of the pain. Dexamethasone , a corticosteroid, has been prescribed to reduce inflammation and pain, to be taken once a day for 7 days. Tizanidine, a muscle relaxant, has also been prescribed to alleviate muscle spasms, with the caution that it may cause drowsiness. The medications have been sent to the pharmacy for immediate pickup. Follow-up with the shoulder team will be arranged after the MRI results are available.    2. Thoracic syrinx:  A thoracic syrinx was partially visualized on the MRI of the neck. An MRI of the thoracic spine with and without contrast is ordered to assess the syrinx and rule out any blockage that could increase the risk of paralysis. If no blockage is found, the syrinx will be monitored without intervention. Follow-up will be scheduled after the test is completed.         Treatment plan:   Orders Placed This Encounter    MRI thoracic spine with and without contrast (27842)    MRI shoulder left without contrast (26778)    dexamethasone  (DECADRON ) 4 MG tablet    tiZANidine (ZANAFLEX) 4 MG tablet        Work Status:     Miscellaneous:     Follow-up: Return for Follow up after MRI.     HISTORY OF PRESENT ILLNESS:  Tara Hogan; [id number]   Age: 68 y.o. Sex: female   Pain score: Pain rating = 9  out of 10      Chief Complaint: Pain of the Neck    History of present illness:  Tara Hogan is a 68 y.o. female with complaints of Pain of the Neck.  The pain is rated 9   out of 10 on the VAS.    No submissions are found.     DAX generated history:     History of Present Illness  The patient is a 68 year old female who presents with complaints of neck pain with radiation down to the left elbow. She rates the pain as a 9 out of 10.    She has been experiencing persistent pain in her left shoulder for the past 3 months, which she describes as aching and constant. The pain extends from her shoulder down to her elbow, and while she can move her arm, raising it exacerbates the pain. The onset of the pain was sudden and not associated with any specific injury. An MRI of her neck was performed, but it did not reveal any abnormalities. She has been diagnosed with arthritis. She has sought relief through the use of a heating pad during sleep and has visited both urgent care and the emergency room, where she was referred to this clinic. Her sleep is often disrupted due to the need to sit in a recliner for comfort. She has tried various  over-the-counter medications, including BC powder, which provides some relief but causes stomach upset. She has also taken green arthritis pills, which help with the pain but induce symptoms similar to acid reflux. She is not diabetic. She has previously taken prednisone  for her condition, but it has been some time since her last dose.    HPI     There are no active problems to display for this patient.      History reviewed. No pertinent family history.     Social History     Tobacco Use    Smoking status: Every Day     Current packs/day: 1.00     Average packs/day: 1 pack/day for 49.8 years (49.8 ttl pk-yrs)     Types: Cigarettes     Start date: 1976    Smokeless tobacco: Never   Substance Use Topics    Alcohol use: Yes       History reviewed. No pertinent past medical history.     Past Surgical  History:   Procedure Laterality Date    NO RELEVANT ORTHOPAEDIC SURGERIES      NO RELEVANT SURGERIES            Current Outpatient Medications:     albuterol  HFA (PROVENTIL  HFA;VENTOLIN  HFA) 108 (90 Base) MCG/ACT inhaler, INHALE 2 PUFFS BY MOUTH EVERY 4 HOURS AS NEEDED FOR WHEEZING AND FOR SHORTNESS OF BREATH, Disp: , Rfl:     aspirin  325 MG EC tablet, Take 325 mg by mouth daily, Disp: , Rfl:     diclofenac (VOLTAREN) 75 MG EC tablet, Take 75 mg by mouth 2 (two) times a day DO NOT CRUSH CHEW OR SPLIT, Disp: , Rfl:     gabapentin (NEURONTIN) 300 MG capsule, Take 1 capsule (300 mg total) by mouth See admin instructions 1 daily for 3 days, then 1 bid for 3 days, then 1 tid, Disp: 90 capsule, Rfl: 5    ipratropium-albuterol  (DUO-NEB) 0.5-2.5 mg/3 mL nebulizer solution, USE 1 AMPULE IN NEBULIZER EVERY 6 HOURS AS NEEDED FOR WHEEZING, Disp: , Rfl:     methocarbamol (ROBAXIN) 750 MG tablet, Take 1 tablet (750 mg total) by mouth 3 (three) times a day as needed for muscle spasms We don't manage long term medications. Please discuss prescriptions with your PCP to check for interactions or contraindications. If effective, transfer future refills and management to your PCP., Disp: 45 tablet, Rfl: 5    Omega-3 Fatty Acids (RA Fish Oil) 1000 MG capsule, Take by mouth, Disp: , Rfl:     oxybutynin XL (DITROPAN XL) 15 MG 24 hr tablet, , Disp: , Rfl:     tolterodine LA (DETROL LA) 4 MG 24 hr capsule, Take by mouth once daily, Disp: , Rfl:     Trelegy Ellipta 200-62.5-25 MCG/ACT aerosol powder , , Disp: , Rfl:     dexamethasone  (DECADRON ) 4 MG tablet, Take 1 tablet (4 mg total) by mouth daily with breakfast for 7 days, Disp: 7 tablet, Rfl: 0    tiZANidine (ZANAFLEX) 4 MG tablet, Take 1 tablet (4 mg total) by mouth every 8 (eight) hours as needed for muscle spasms We don't manage long term medications. Please discuss prescriptions with your PCP to check for interactions or contraindications. If effective, transfer future  refills and management to your PCP., Disp: 90 tablet, Rfl: 5    No Known Allergies     ROS:   No new bowel or bladder incontinence.  No fever.  No saddle anesthesia.  OBJECTIVE:  Ht 5' 9   Wt 215 lb 3.2 oz (97.6 kg)   BMI 31.78 kg/m   Body mass index is 31.78 kg/m., a BMI over 30 is considered obese and a BMI over 40 has been associated with a higher risk of surgical complications.    Constitutional: No acute distress. Well nourished.  HEENT: Normocephalic.  Respiratory:  No labored breathing.  Cardiovascular:  No marked cyanosis.  Skin:  No marked skin ulcers/lesions on bilateral upper or lower extremities.  Musculoskeletal/Neurological:   +1 DTRs               DAX generated physical exam:  Physical Exam  Musculoskeletal:  Left shoulder: Limited range of motion, unable to push away with arm    RESULTS REVIEWED:          X-ray cervical spine complete 4 or 5 views ([id number])  Result Date: 08/02/2024  Standing. AP, Lateral, Flexion, Extension.     Impression: Age appropriate degenerative changes, no fracture or dislocation.       I have personally and independently reviewed the MRI cervical spine performed at [facility] on 08/17/24.  It shows age appropriate degenerative changes.  Partially visualized thoracic cord expansion which may represent a syrinx.  No significant central canal nor foraminal stenosis.      DAX generated test results:  Results  Imaging   - MRI of the neck: Essentially normal, no pinched nerve detected.   - MRI of the neck: Showed a lesion in the upper back called the syrinx.        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      PDMP reviewed by     Our practice treats spine problems with a comprehensive, multi-modal approach involving rehab, short course of non-narcotic medications, injections and surgery (if clinically indicated).  Today we discussed the diagnosis and engaged in the shared decision-making process regarding testing, treatment options, along with risks and benefits.  Channing Louder (and present family/friends) was/were an active participant on this conversation and had all questions satisfactorily answered. The agreed upon treatment plan after todays visit is listed on the plan section above.  If medication treatment is effective and becomes a long term solution, we recommend transferring the prescriptions to the PCP for monitoring of labs and interactions.    This patient has been enrolled and is participating in a physician directed physical therapy program as part of their spinal care treatment plan.  This program was designed by the [facility] and is medically necessary to address pain, restore functional mobility, and improve core strength and stability.  Specific exercises and prescribed frequency are outlined below.  The program is being monitored under the supervision of the referring spine physician or APC (unless otherwise stated) to ensure therapeutic benefit and to support safe return to daily activities.    Purpose of Program   After an injury or surgery, an exercise conditioning program will help you return to daily activities and enjoy a more active, healthy lifestyle. Following a well-structured conditioning program will also help you return to sports and other recreational activities.  This is a general conditioning program that provides a wide range of exercises. To ensure that the program is safe and effective for you, it should be performed under your doctor's supervision. Talk to your doctor or physical therapist about which exercises will best help you meet your rehabilitation goals.      Strength: Strengthening the muscles that support your spine will help keep your back and upper body stable. Keeping these muscles strong can relieve back pain and prevent further injury.     Flexibility: Stretching the muscles that you strengthen is important for restoring range of motion and preventing injury. Gently stretching after strengthening exercises can help reduce muscle soreness and keep your muscles long and flexible.     Target Muscles: The muscle groups targeted in this conditioning program include:   Cervical spine (neck)  External oblique rotators (side and lower back)  Trapezius (neck and upper back)  Internal oblique rotators (side and lower back)  Latissimus dorsi (side and middle back)  Piriformis (buttocks)  Back extensors and erector spinae  Gluteus maximus (buttocks) (middle and lower back)  Gluteus medias (buttocks)  Quadratus lumborum (lower back)  Hamstrings (back of thigh)  Abdominals    Length of program: This spine conditioning program should be continued for 4 to 6 weeks, unless otherwise specified by your doctor or physical therapist. After your recovery, these exercises can be continued as a maintenance program for lifelong protection and health of your spine. Performing the exercises two to three days a week will maintain strength and range of motion in your back.     Getting Started   Warm up: Before doing the following exercises, warm up with 5 to 10 minutes of low impact activity, like walking or riding a stationary bicycle.     Stretch: After the warm-up, do the stretching exercises shown on Page 1 before moving on to the strengthening exercises. When you have completed the strengthening exercises, repeat the stretching exercises to end the program.     Do not ignore pain: You should not feel pain during an exercise. Talk to your doctor or physical therapist if you have any pain while exercising.     Ask questions: If you are not sure how to do an exercise, or how often to do  it, contact your doctor or physical therapist.     1. Head Rolls   Repetitions 3 sets of 3  Days per week Daily   Main muscles worked: Cervical spine muscles, trapezius  You should feel this stretch all around your neck and into your upper back  Equipment needed: None   Step-by-step directions  Sit in a chair or stand with your weight evenly distributed on both feet.  Gently bring your chin toward your chest.  Roll your head to the right and turn so that your ear is over your shoulder (1). Hold for 5 seconds.   Gently roll your head back toward your chest and to the left. Turn your head so that your ear is over your left shoulder (2). Hold for 5 seconds.   Slowly roll your head back and in a clockwise circle three times (3).  Reverse directions and slow roll your head in a counterclockwise circle three times (4).   Tip Do not shrug your shoulders up during this exercise.       2. Kneeling Back Extension   Repetitions 10  Days per week Daily   Main muscles worked: Quadratus lumborum, erector spinae  You should  feel this stretch in your lower back and your abdominals  Equipment needed: None   Step-by-step directions  Begin on your hands and knees with your shoulders positioned over your hands.  Rock forward onto your arms, round your shoulders and allow your low back to drop toward the floor. Hold for 5 seconds.   Rock backward and sit your buttocks as close to your heels as possible. Extend your arms and hold for 5 seconds.   Tip Look down on the floor to keep your neck in alignment with your spine.       3. Sitting Rotation Stretch   Repetitions 2 sets of 4  Days per week Daily   Main muscles worked: Piriformis, external oblique rotators, internal oblique rotators  You should feel this stretch in your buttocks, as well as at your sides  Equipment needed: None   Step-by-step directions  Sit on the floor with both legs straight out in front of you. Cross one leg over the other.   Slowly twist toward your bent leg,  putting your hand behind you for support.  Place your opposite arm on the side of your bent thigh and use it to help you twist further.   Look over your shoulder and hold the stretch for 30 seconds. Slowly come back to center.   Repeat on the other side. Repeat the entire sequence 4 times.  Tip Sit up tall and keep your sit bones pressed into the floor throughout the stretch.       4. Modified Seat Side Straddle   Repetitions 10 each side  Days per week Daily   Main muscles worked: Hamstrings, extensor muscles, erector spinae  You should feel this stretch in the back of your thighs and into your lower and middle back  Equipment needed: None   Step-by-step directions  Sit on the floor with one leg extended to the side and the other leg bent.  Keep your back straight and bend from your hips toward the foot of your straight leg. Reach your hands toward your toes and hold for 5 seconds.   Slowly round your spine and bring your hands to your shin or ankle. Bring your head down as close to your knee as possible.   Hold for 30 seconds and then relax for 30 seconds.  Repeat on the other side. Repeat the sequence 10 times.  Tip Keep your extended leg straight as you bring your head down.       5. Knee to Chest   Repetitions 3 sets of 10  Days per week Daily   Main muscles worked: Quadratus lumborum  You should feel this stretch in your lower back, as well as in the front of your hip and inner thigh  Equipment needed: None   Step-by-step directions  Lie on your back on the floor.  Lift one leg and bring your knee toward your chest. Grasp your knee or shin and pull your leg in as far as it will go.   Tighten your abdominals and press your spine to the floor. Hold for 5 seconds.  Repeat on the other side, then pull both legs in together. Repeat the sequence 10 times.   Tip Keep your spine aligned to the floor throughout the sequence.       6. Bird Dog   Repetitions 5  Days per week Daily   Main muscles worked: Back extensors,  erector spinae, gluteal muscles  You should feel this exercise in your  lower back and into your buttocks  Equipment needed: None   Step-by-step directions  Begin on your hands and knees with your shoulders positioned over your hands and your hips directly over your knees.   Tighten your abdominal muscles and raise one arm straight out to shoulder-height and level with your body. Hold until you feel balanced.   Slowly lift and extend the opposite leg straight out from your hip.  Tighten the muscles in your buttocks and thigh, and hold this position for 15 seconds.  Slowly return to the start position and repeat with the opposite arm and leg.  Tip Keep your stomach muscles tight and your back flat to stay balanced.       7. Plank   Repetitions 5  Days per week Daily   Main muscles worked: Back extensors, erector spinae, quadratus lumborum, abdominals  You should feel this exercise in your middle to lower back, abdominals, and gluteal muscles  Equipment needed: None   Step-by-step directions  Lie on your stomach with your forearms on the floor and your elbows directly below your shoulders.   Tighten your abdominal muscles and lift your hips off of the floor.  Squeeze your gluteal muscles and lift your knees off of the floor.  Keep your body straight and hold for 30 seconds. If you cannot hold this position, bring your knees back to the floor and hold with just your hips lifted.   Slowly return to the start position and rest 30 seconds. Repeat.  Tip Do not let your pelvis sag toward the floor. Keep your stomach muscles tight.       8. Modified Side Plank   Repetitions 5  Days per week Daily   Main muscles worked: Quadratus lumborum, external oblique rotators, internal oblique rotators You should feel this exercise in your lower back, waist, and abdominals  Equipment needed: None   Step-by-step directions  Lie on your side on the floor with your bottom leg slightly bent and top leg straight. Your elbow should be directly  under your shoulder with your forearm extended on the floor in front of you.   Tighten your abdominal muscles and raise your hip off of the floor.  If you can, straighten your bottom leg and lift your knee off of the floor as shown.  Keep your body straight and hold this position for 15 seconds.  Slowly return to the start position and repeat on the other side.  Tip Keep neck in alignment with your spine and do not shrug your shoulder up to your ear.       9. Hip Bridge   Repetitions 5  Days per week Daily   Main muscles worked: Lower back extensor, erector spinae, gluteal muscles, hamstrings  You should feel this exercise in your lower back, buttocks, and back of your thigh  Equipment needed: None   Step-by-step directions  Lie on your back on the floor with your arms at your sides, your knees bent, and your feet flat on the floor.   Tighten your abdominal and gluteal muscles and lift your pelvis so that your body is in a straight line from your shoulders to your knees.   Hold this position for 15 seconds.  Slowly return to the start position and repeat.  Tip Center your weight over your shoulder blades. Do not tense up in your neck.       10. Abdominal Bracing   Repetitions 5  Days per week Daily  Main muscles worked: Abdominals  You should feel this exercise in your stomach muscles  Equipment needed: None   Step-by-step directions  Lie on your back on the floor with your knees bent and arms at your sides.  Tighten your abdominal muscles so that your stomach pulls away from your waistband.  Hold this position for 15 seconds.  Tip Flatten your lower back into the floor.       11. Abdominal Crunch   Repetitions 2 sets of 10  Days per week Daily   Main muscles worked: Abdominals  You should feel this exercise in your stomach muscles  Equipment needed: None   Step-by-step directions  Lie on your back on the floor with your knees bent and hands at the back of your head with your elbows open wide.   Tighten your  abdominal muscles and lift your head and shoulder blades off of the floor.   Keep your back flat to the floor and hold for 2 seconds.  Slowly lower and repeat.  Tip Relax your neck and do not pull on your head with your hands.           This document was prepared using Dragon voice recognition and [employer] software, with the patients or patients representatives consent. As with any speech-to-text technology, unintentional errors in grammar, syntax, word choice (including homophones), or interpretation may occur. While efforts have been made to proofread and ensure accuracy, some errors may remain. We appreciate your understanding.      This document was prepared using Dragon voice recognition and [employer] software, with the patients or patients representatives consent. As with any speech-to-text technology, unintentional errors in grammar, syntax, word choice (including homophones), or interpretation may occur. While efforts have been made to proofread and ensure accuracy, some errors may remain. We appreciate your understanding.    Lauree Frieze, MD  "

## 2024-09-01 ENCOUNTER — Encounter

## 2024-09-08 ENCOUNTER — Encounter

## 2024-09-08 ENCOUNTER — Inpatient Hospital Stay: Admit: 2024-09-08 | Payer: Medicare (Managed Care) | Attending: Orthopaedic Surgery

## 2024-09-08 DIAGNOSIS — M542 Cervicalgia: Secondary | ICD-10-CM

## 2024-09-11 DIAGNOSIS — I10 Essential (primary) hypertension: Secondary | ICD-10-CM | POA: Diagnosis not present

## 2024-09-13 DIAGNOSIS — E66812 Obesity, class 2: Secondary | ICD-10-CM | POA: Diagnosis not present

## 2024-09-13 DIAGNOSIS — G4733 Obstructive sleep apnea (adult) (pediatric): Secondary | ICD-10-CM | POA: Diagnosis not present

## 2024-09-13 DIAGNOSIS — I10 Essential (primary) hypertension: Secondary | ICD-10-CM | POA: Diagnosis not present

## 2024-09-13 DIAGNOSIS — E65 Localized adiposity: Secondary | ICD-10-CM | POA: Diagnosis not present

## 2024-09-13 DIAGNOSIS — E782 Mixed hyperlipidemia: Secondary | ICD-10-CM | POA: Diagnosis not present

## 2024-09-13 DIAGNOSIS — Z6836 Body mass index (BMI) 36.0-36.9, adult: Secondary | ICD-10-CM | POA: Diagnosis not present

## 2024-09-18 DIAGNOSIS — Z0000 Encounter for general adult medical examination without abnormal findings: Secondary | ICD-10-CM | POA: Diagnosis not present

## 2024-09-18 DIAGNOSIS — J309 Allergic rhinitis, unspecified: Secondary | ICD-10-CM | POA: Diagnosis not present

## 2024-09-18 DIAGNOSIS — I10 Essential (primary) hypertension: Secondary | ICD-10-CM | POA: Diagnosis not present

## 2024-09-18 DIAGNOSIS — K219 Gastro-esophageal reflux disease without esophagitis: Secondary | ICD-10-CM | POA: Diagnosis not present

## 2024-09-18 DIAGNOSIS — J4520 Mild intermittent asthma, uncomplicated: Secondary | ICD-10-CM | POA: Diagnosis not present

## 2024-09-18 DIAGNOSIS — E782 Mixed hyperlipidemia: Secondary | ICD-10-CM | POA: Diagnosis not present

## 2024-09-22 DIAGNOSIS — J4520 Mild intermittent asthma, uncomplicated: Secondary | ICD-10-CM | POA: Diagnosis not present

## 2024-09-22 DIAGNOSIS — E782 Mixed hyperlipidemia: Secondary | ICD-10-CM | POA: Diagnosis not present

## 2024-09-22 DIAGNOSIS — I10 Essential (primary) hypertension: Secondary | ICD-10-CM | POA: Diagnosis not present

## 2024-09-22 DIAGNOSIS — E66813 Obesity, class 3: Secondary | ICD-10-CM | POA: Diagnosis not present

## 2024-09-28 DIAGNOSIS — R351 Nocturia: Secondary | ICD-10-CM | POA: Diagnosis not present

## 2024-10-04 DIAGNOSIS — E65 Localized adiposity: Secondary | ICD-10-CM | POA: Diagnosis not present

## 2024-10-04 DIAGNOSIS — I10 Essential (primary) hypertension: Secondary | ICD-10-CM | POA: Diagnosis not present

## 2024-10-04 DIAGNOSIS — E782 Mixed hyperlipidemia: Secondary | ICD-10-CM | POA: Diagnosis not present

## 2024-10-04 DIAGNOSIS — E66812 Obesity, class 2: Secondary | ICD-10-CM | POA: Diagnosis not present

## 2024-10-04 DIAGNOSIS — Z6836 Body mass index (BMI) 36.0-36.9, adult: Secondary | ICD-10-CM | POA: Diagnosis not present

## 2024-10-04 DIAGNOSIS — G4733 Obstructive sleep apnea (adult) (pediatric): Secondary | ICD-10-CM | POA: Diagnosis not present

## 2024-10-11 DIAGNOSIS — I10 Essential (primary) hypertension: Secondary | ICD-10-CM | POA: Diagnosis not present

## 2024-10-22 DIAGNOSIS — J4520 Mild intermittent asthma, uncomplicated: Secondary | ICD-10-CM | POA: Diagnosis not present

## 2024-10-22 DIAGNOSIS — E782 Mixed hyperlipidemia: Secondary | ICD-10-CM | POA: Diagnosis not present

## 2024-10-22 DIAGNOSIS — E66813 Obesity, class 3: Secondary | ICD-10-CM | POA: Diagnosis not present

## 2024-11-01 DIAGNOSIS — Z124 Encounter for screening for malignant neoplasm of cervix: Secondary | ICD-10-CM | POA: Diagnosis not present

## 2024-11-01 DIAGNOSIS — Z6836 Body mass index (BMI) 36.0-36.9, adult: Secondary | ICD-10-CM | POA: Diagnosis not present
